# Patient Record
Sex: Male | Born: 1970 | Race: White | Hispanic: No | Smoking: Former smoker
Health system: Southern US, Community
[De-identification: ages and names within clinical notes are randomized; demographics above are authoritative.]

## PROBLEM LIST (undated history)

## (undated) DIAGNOSIS — E785 Hyperlipidemia, unspecified: Secondary | ICD-10-CM

## (undated) DIAGNOSIS — I2510 Atherosclerotic heart disease of native coronary artery without angina pectoris: Secondary | ICD-10-CM

## (undated) DIAGNOSIS — I10 Essential (primary) hypertension: Secondary | ICD-10-CM

## (undated) DIAGNOSIS — Z720 Tobacco use: Secondary | ICD-10-CM

## (undated) HISTORY — DX: Atherosclerotic heart disease of native coronary artery without angina pectoris: I25.10

## (undated) HISTORY — PX: KNEE ARTHROSCOPY: SHX127

## (undated) HISTORY — DX: Hyperlipidemia, unspecified: E78.5

## (undated) HISTORY — DX: Tobacco use: Z72.0

## (undated) HISTORY — PX: SHOULDER SURGERY: SHX246

---

## 2014-07-17 ENCOUNTER — Ambulatory Visit (HOSPITAL_COMMUNITY): Payer: Self-pay | Attending: Family Medicine

## 2014-07-17 ENCOUNTER — Emergency Department (INDEPENDENT_AMBULATORY_CARE_PROVIDER_SITE_OTHER): Payer: Self-pay

## 2014-07-17 ENCOUNTER — Emergency Department (INDEPENDENT_AMBULATORY_CARE_PROVIDER_SITE_OTHER)
Admission: EM | Admit: 2014-07-17 | Discharge: 2014-07-17 | Disposition: A | Discharge status: Home / Self Care | Payer: Self-pay | Source: Home / Self Care | Attending: Family Medicine | Admitting: Family Medicine

## 2014-07-17 ENCOUNTER — Encounter (HOSPITAL_COMMUNITY): Payer: Self-pay | Admitting: Emergency Medicine

## 2014-07-17 DIAGNOSIS — M7989 Other specified soft tissue disorders: Secondary | ICD-10-CM | POA: Insufficient documentation

## 2014-07-17 DIAGNOSIS — S93602A Unspecified sprain of left foot, initial encounter: Secondary | ICD-10-CM

## 2014-07-17 DIAGNOSIS — R52 Pain, unspecified: Secondary | ICD-10-CM

## 2014-07-17 DIAGNOSIS — M79672 Pain in left foot: Secondary | ICD-10-CM | POA: Insufficient documentation

## 2014-07-17 HISTORY — DX: Essential (primary) hypertension: I10

## 2014-07-17 MED ORDER — HYDROCODONE-ACETAMINOPHEN 5-325 MG PO TABS: 1.0000 | ORAL_TABLET | Freq: Four times a day (QID) | ORAL | Status: DC | PRN

## 2014-07-17 NOTE — ED Notes (Signed)
Reports stepping in a whole on Tuesday and rolling left foot.  Pt is c/o pain and swelling across the top of the left foot.  Bruising noted.    Pt has iced, soaked, and wrapped foot with no relief in pain.

## 2014-07-17 NOTE — Discharge Instructions (Signed)
Thank you for coming in today. If not better follow-up with [facility], or [facility].   Please call or see Ms Antionette CharMaggy Mena for assistance with your bill.  You may qualify for reduced or free services.  Her phone number is [phone]. Her email is [email or web address]  Foot Sprain The muscles and cord like structures which attach muscle to bone (tendons) that surround the feet are made up of units. A foot sprain can occur at the weakest spot in any of these units. This condition is most often caused by injury to or overuse of the foot, as from playing contact sports, or aggravating a previous injury, or from poor conditioning, or obesity. SYMPTOMS  Pain with movement of the foot.  Tenderness and swelling at the injury site.  Loss of strength is present in moderate or severe sprains. THE THREE GRADES OR SEVERITY OF FOOT SPRAIN ARE:  Mild (Grade I): Slightly pulled muscle without tearing of muscle or tendon fibers or loss of strength.  Moderate (Grade II): Tearing of fibers in a muscle, tendon, or at the attachment to bone, with small decrease in strength.  Severe (Grade III): Rupture of the muscle-tendon-bone attachment, with separation of fibers. Severe sprain requires surgical repair. Often repeating (chronic) sprains are caused by overuse. Sudden (acute) sprains are caused by direct injury or over-use. DIAGNOSIS  Diagnosis of this condition is usually by your own observation. If problems continue, a caregiver may be required for further evaluation and treatment. X-rays may be required to make sure there are not breaks in the bones (fractures) present. Continued problems may require physical therapy for treatment. PREVENTION  Use strength and conditioning exercises appropriate for your sport.  Warm up properly prior to working out.  Use athletic shoes that are made for the sport you are participating in.  Allow adequate time for  healing. Early return to activities makes repeat injury more likely, and can lead to an unstable arthritic foot that can result in prolonged disability. Mild sprains generally heal in 3 to 10 days, with moderate and severe sprains taking 2 to 10 weeks. Your caregiver can help you determine the proper time required for healing. HOME CARE INSTRUCTIONS   Apply ice to the injury for 15-20 minutes, 03-04 times per day. Put the ice in a plastic bag and place a towel between the bag of ice and your skin.  An elastic wrap (like an Ace bandage) may be used to keep swelling down.  Keep foot above the level of the heart, or at least raised on a footstool, when swelling and pain are present.  Try to avoid use other than gentle range of motion while the foot is painful. Do not resume use until instructed by your caregiver. Then begin use gradually, not increasing use to the point of pain. If pain does develop, decrease use and continue the above measures, gradually increasing activities that do not cause discomfort, until you gradually achieve normal use.  Use crutches if and as instructed, and for the length of time instructed.  Keep injured foot and ankle wrapped between treatments.  Massage foot and ankle for comfort and to keep swelling down. Massage from the toes up towards the knee.  Only take over-the-counter or prescription medicines for pain, discomfort, or fever as directed by your caregiver. SEEK IMMEDIATE MEDICAL CARE IF:   Your pain and swelling increase, or pain is not controlled with medications.  You have loss of feeling in your foot or your  foot turns cold or blue.  You develop new, unexplained symptoms, or an increase of the symptoms that brought you to your caregiver. MAKE SURE YOU:   Understand these instructions.  Will watch your condition.  Will get help right away if you are not doing well or get worse. Document Released: 11/19/2001 Document Revised: 08/22/2011 Document  Reviewed: 01/17/2008 [facility] Patient Information 2015 [place], [place]. This information is not intended to replace advice given to you by your health care provider. Make sure you discuss any questions you have with your health care provider.

## 2014-07-17 NOTE — ED Provider Notes (Signed)
Timothy Molson Coors BrewingHowle Jr. is a 44 y.o. male who presents to Urgent Care today for left foot injury. Patient stepped into a hole 2 days ago injuring his mid foot. He has pain and swelling at his left dorsal midfoot. He notes pain is worse with ambulation. No radiating pain weakness or numbness.   Past Medical History  Diagnosis Date  . Hypertension    Past Surgical History  Procedure Laterality Date  . Shoulder surgery     History  Substance Use Topics  . Smoking status: Current Every Day Smoker -- 0.50 packs/day    Types: Cigarettes  . Smokeless tobacco: Not on file  . Alcohol Use: No   ROS as above Medications: No current facility-administered medications for this encounter.   Current Outpatient Prescriptions  Medication Sig Dispense Refill  . HYDROcodone-acetaminophen (NORCO/VICODIN) 5-325 MG per tablet Take 1 tablet by mouth every 6 (six) hours as needed. 15 tablet 0   No Known Allergies   Exam:  BP 144/97 mmHg  Pulse 102  Temp(Src) 97.9 F (36.6 C) (Oral)  Resp 18  SpO2 95% Gen: Well NAD Left lower extremity: He is nontender normal motion Ankle nontender normal motion Foot swollen and tender overlying the dorsal midfoot especially of the Lisfranc area.  Pulses capillary refill and sensation are intact.   No results found for this or any previous visit (from the past 24 hour(s)). Dg Foot 2 Views Left  07/17/2014   CLINICAL DATA:  Left foot pain and swelling.  EXAM: LEFT FOOT - 2 views  COMPARISON:  None.  FINDINGS: There is no evidence of fracture or dislocation. Weight-bearing projections were obtained. Joint spaces are intact. Mild spurring of posterior calcaneus is noted. Soft tissues are unremarkable.  IMPRESSION: No acute abnormality seen in the left foot.   Electronically Signed   By: Timothy LiasJames  [name] M.D.   On: 07/17/2014 20:02   Dg Foot Complete Left  07/17/2014   CLINICAL DATA:  44 year old male with a history of foot injury. Injury occurred 2 days prior.  EXAM: LEFT FOOT -  COMPLETE 3+ VIEW  COMPARISON:  07/17/2014  FINDINGS: No evidence of acute bony abnormality. No significant soft tissue swelling. No radiopaque foreign body.  Enthesophyte formation at the insertion of the plantar fascia.  Mild degenerative changes of the interphalangeal joints.  IMPRESSION: No acute bony abnormality.  Signed,  Timothy NeuJaime S. Loreta AveWagner, DO  [facility]  [facility]   Electronically Signed   By: Timothy MorJaime  [name] D.O.   On: 07/17/2014 20:35    Assessment and Plan: 44 y.o. male with foot sprain. No radiographic evidence for Lisfranc injury. Treat with limited weightbearing with postop shoe. Naproxen and Norco for pain control. Follow up with sports medicine orthopedics as needed.  Discussed warning signs or symptoms. Please see discharge instructions. Patient expresses understanding.     Timothy BongEvan S Jonah Nestle, MD 07/17/14 2052

## 2015-11-16 ENCOUNTER — Ambulatory Visit: Payer: Self-pay | Admitting: Physical Therapy

## 2016-06-08 ENCOUNTER — Ambulatory Visit (INDEPENDENT_AMBULATORY_CARE_PROVIDER_SITE_OTHER): Payer: BLUE CROSS/BLUE SHIELD | Admitting: Physician Assistant

## 2016-06-08 ENCOUNTER — Ambulatory Visit (INDEPENDENT_AMBULATORY_CARE_PROVIDER_SITE_OTHER): Payer: BLUE CROSS/BLUE SHIELD

## 2016-06-08 VITALS — BP 142/86 | HR 120 | Temp 98.4°F | Resp 18 | Ht 70.0 in

## 2016-06-08 DIAGNOSIS — M109 Gout, unspecified: Secondary | ICD-10-CM | POA: Diagnosis not present

## 2016-06-08 DIAGNOSIS — M79672 Pain in left foot: Secondary | ICD-10-CM | POA: Diagnosis not present

## 2016-06-08 MED ORDER — INDOMETHACIN 50 MG PO CAPS: 50.0000 mg | ORAL_CAPSULE | Freq: Two times a day (BID) | ORAL | 0 refills | Status: AC

## 2016-06-08 NOTE — Progress Notes (Signed)
   Timothy Molson Coors BrewingHowle Jr.  MRN: [id number] DOB: 08/20/1970  PCP: No primary care provider on file.  Subjective:  Pt is a 45 year old male who presents to clinic for left foot pain x five days.  Pain started five days ago on the outside of his left foot. Hurts more with walking, however pain is still present when he is non-weightbearing. Last night he noticed pain at the knuckle of his left great toe. Pain was so bad it hurt when the bed sheets touched it. Feels hot to touch.  He reports an increase in the amount he has been walking recently at work. He has eaten a lot more meat products during the holiday.  History of gout  - several years ago left knee.   Review of Systems  Constitutional: Negative for chills, diaphoresis and fever.  Respiratory: Negative for cough, chest tightness, shortness of breath and wheezing.   Cardiovascular: Negative for chest pain and palpitations.  Gastrointestinal: Negative for diarrhea, nausea and vomiting.  Musculoskeletal: Positive for arthralgias and gait problem.  Skin: Positive for color change.  Neurological: Negative for dizziness, syncope, light-headedness and headaches.  Psychiatric/Behavioral: Negative for sleep disturbance. The patient is not nervous/anxious.     There are no active problems to display for this patient.   No current outpatient prescriptions on file prior to visit.   No current facility-administered medications on file prior to visit.     No Known Allergies   Objective:  BP (!) 142/86 (BP Location: Right Arm, Patient Position: Sitting, Cuff Size: Large)   Pulse (!) 120   Temp 98.4 F (36.9 C)   Resp 18   Ht 5\' 10"  (1.778 m)   SpO2 97%   Physical Exam  Constitutional: He is oriented to person, place, and time and well-developed, well-nourished, and in no distress. No distress.  Cardiovascular: Normal rate, regular rhythm and normal heart sounds.   Pulmonary/Chest: Effort normal. No respiratory distress.  Musculoskeletal:      Left foot: There is tenderness (MTP joint Great toe), bony tenderness (Proximal 5th metatarsal) and swelling. There is no crepitus and no deformity.  Neurological: He is alert and oriented to person, place, and time. GCS score is 15.  Skin: Skin is warm and dry.  Psychiatric: Mood, memory, affect and judgment normal.  Vitals reviewed.  Dg Foot Complete Left  Result Date: 06/08/2016 CLINICAL DATA:  Pain at the proximal fifth metatarsal. EXAM: LEFT FOOT - COMPLETE 3+ VIEW COMPARISON:  07/27/2014. FINDINGS: No acute bone or soft tissue abnormality is present. An accessory ossicle is noted lateral to the cuboid. The fifth metatarsal is intact. A small calcaneal spur is noted. IMPRESSION: 1. No acute abnormality or significant interval change. Electronically Signed   By: Marin Robertshristopher  Mattern M.D.   On: 06/08/2016 14:27   Assessment and Plan :  1. Left foot pain 2. Acute gout of left foot, unspecified cause - DG Foot Complete Left; Future - indomethacin (INDOCIN) 50 MG capsule; Take 1 capsule (50 mg total) by mouth 2 (two) times daily with a meal.  Dispense: 14 capsule; Refill: 0 - Advised patient about gout diet. RTC in 7-10 days if no improvement.    Marco CollieWhitney Anara Cowman, PA-C  [facility] 06/08/2016 2:04 PM

## 2016-06-08 NOTE — Patient Instructions (Addendum)
Please take your medication as directed. See below for gout information. Come back if you are not better after one week.   Gout Gout is painful swelling that can occur in some of your joints. Gout is a type of arthritis. This condition is caused by having too much uric acid in your body. Uric acid is a chemical that forms when your body breaks down substances called purines. Purines are important for building body proteins. When your body has too much uric acid, sharp crystals can form and build up inside your joints. This causes pain and swelling. Gout attacks can happen quickly and be very painful (acute gout). Over time, the attacks can affect more joints and become more frequent (chronic gout). Gout can also cause uric acid to build up under your skin and inside your kidneys. What are the causes? This condition is caused by too much uric acid in your blood. This can occur because:  Your kidneys do not remove enough uric acid from your blood. This is the most common cause.  Your body makes too much uric acid. This can occur with some cancers and cancer treatments. It can also occur if your body is breaking down too many red blood cells (hemolytic anemia).  You eat too many foods that are high in purines. These foods include organ meats and some seafood. Alcohol, especially beer, is also high in purines. A gout attack may be triggered by trauma or stress. What increases the risk? This condition is more likely to develop in people who:  Have a family history of gout.  Are male and middle-aged.  Are male and have gone through menopause.  Are obese.  Frequently drink alcohol, especially beer.  Are dehydrated.  Lose weight too quickly.  Have an organ transplant.  Have lead poisoning.  Take certain medicines, including aspirin, cyclosporine, diuretics, levodopa, and niacin.  Have kidney disease or psoriasis. What are the signs or symptoms? An attack of acute gout happens  quickly. It usually occurs in just one joint. The most common place is the big toe. Attacks often start at night. Other joints that may be affected include joints of the feet, ankle, knee, fingers, wrist, or elbow. Symptoms may include:  Severe pain.  Warmth.  Swelling.  Stiffness.  Tenderness. The affected joint may be very painful to touch.  Shiny, red, or purple skin.  Chills and fever. Chronic gout may cause symptoms more frequently. More joints may be involved. You may also have white or yellow lumps (tophi) on your hands or feet or in other areas near your joints. How is this diagnosed? This condition is diagnosed based on your symptoms, medical history, and physical exam. You may have tests, such as:  Blood tests to measure uric acid levels.  Removal of joint fluid with a needle (aspiration) to look for uric acid crystals.  X-rays to look for joint damage. How is this treated? Treatment for this condition has two phases: treating an acute attack and preventing future attacks. Acute gout treatment may include medicines to reduce pain and swelling, including:  NSAIDs.  Steroids. These are strong anti-inflammatory medicines that can be taken by mouth (orally) or injected into a joint.  Colchicine. This medicine relieves pain and swelling when it is taken soon after an attack. It can be given orally or through an IV tube. Preventive treatment may include:  Daily use of smaller doses of NSAIDs or colchicine.  Use of a medicine that reduces uric acid levels in  your blood.  Changes to your diet. You may need to see a specialist about healthy eating (dietitian). Follow these instructions at home: During a Gout Attack  If directed, apply ice to the affected area:  Put ice in a plastic bag.  Place a towel between your skin and the bag.  Leave the ice on for 20 minutes, 2-3 times a day.  Rest the joint as much as possible. If the affected joint is in your leg, you may be  given crutches to use.  Raise (elevate) the affected joint above the level of your heart as often as possible.  Drink enough fluids to keep your urine clear or pale yellow.  Take over-the-counter and prescription medicines only as told by your health care provider.  Do not drive or operate heavy machinery while taking prescription pain medicine.  Follow instructions from your health care provider about eating or drinking restrictions.  Return to your normal activities as told by your health care provider. Ask your health care provider what activities are safe for you. Avoiding Future Gout Attacks  Follow a low-purine diet as told by your dietitian or health care provider. Avoid foods and drinks that are high in purines, including liver, kidney, anchovies, asparagus, herring, mushrooms, mussels, and beer.  Limit alcohol intake to no more than 1 drink a day for nonpregnant women and 2 drinks a day for men. One drink equals 12 oz of beer, 5 oz of wine, or 1 oz of hard liquor.  Maintain a healthy weight or lose weight if you are overweight. If you want to lose weight, talk with your health care provider. It is important that you do not lose weight too quickly.  Start or maintain an exercise program as told by your health care provider.  Drink enough fluids to keep your urine clear or pale yellow.  Take over-the-counter and prescription medicines only as told by your health care provider.  Keep all follow-up visits as told by your health care provider. This is important. Contact a health care provider if:  You have another gout attack.  You continue to have symptoms of a gout attack after10 days of treatment.  You have side effects from your medicines.  You have chills or a fever.  You have burning pain when you urinate.  You have pain in your lower back or belly. Get help right away if:  You have severe or uncontrolled pain.  You cannot urinate. This information is not  intended to replace advice given to you by your health care provider. Make sure you discuss any questions you have with your health care provider. Document Released: 05/27/2000 Document Revised: 11/05/2015 Document Reviewed: 03/12/2015 Elsevier Interactive Patient Education  2017 [employer]    IF you received an x-ray today, you will receive an invoice from [facility]. Please contact [facility] at [phone] with questions or concerns regarding your invoice.   IF you received labwork today, you will receive an invoice from [facility]. Please contact [facility] at [phone] with questions or concerns regarding your invoice.   Our billing staff will not be able to assist you with questions regarding bills from these companies.  You will be contacted with the lab results as soon as they are available. The fastest way to get your results is to activate your My Chart account. Instructions are located on the last page of this paperwork. If you have not heard from us regarding the results in 2 weeks, please contact this office.

## 2016-12-05 DIAGNOSIS — J029 Acute pharyngitis, unspecified: Secondary | ICD-10-CM | POA: Diagnosis not present

## 2017-07-11 DIAGNOSIS — M109 Gout, unspecified: Secondary | ICD-10-CM | POA: Diagnosis not present

## 2017-07-11 DIAGNOSIS — M79672 Pain in left foot: Secondary | ICD-10-CM | POA: Diagnosis not present

## 2017-07-11 DIAGNOSIS — J019 Acute sinusitis, unspecified: Secondary | ICD-10-CM | POA: Diagnosis not present

## 2017-09-07 DIAGNOSIS — J209 Acute bronchitis, unspecified: Secondary | ICD-10-CM | POA: Diagnosis not present

## 2018-03-24 ENCOUNTER — Emergency Department (HOSPITAL_COMMUNITY)
Admission: EM | Admit: 2018-03-24 | Discharge: 2018-03-24 | Disposition: A | Discharge status: Home / Self Care | Payer: Worker's Compensation | Attending: Emergency Medicine | Admitting: Emergency Medicine

## 2018-03-24 ENCOUNTER — Other Ambulatory Visit: Payer: Self-pay

## 2018-03-24 ENCOUNTER — Encounter (HOSPITAL_COMMUNITY): Payer: Self-pay | Admitting: Emergency Medicine

## 2018-03-24 DIAGNOSIS — Y929 Unspecified place or not applicable: Secondary | ICD-10-CM | POA: Insufficient documentation

## 2018-03-24 DIAGNOSIS — X500XXA Overexertion from strenuous movement or load, initial encounter: Secondary | ICD-10-CM | POA: Diagnosis not present

## 2018-03-24 DIAGNOSIS — I10 Essential (primary) hypertension: Secondary | ICD-10-CM | POA: Diagnosis not present

## 2018-03-24 DIAGNOSIS — Y999 Unspecified external cause status: Secondary | ICD-10-CM | POA: Diagnosis not present

## 2018-03-24 DIAGNOSIS — S39012A Strain of muscle, fascia and tendon of lower back, initial encounter: Secondary | ICD-10-CM | POA: Diagnosis not present

## 2018-03-24 DIAGNOSIS — F17210 Nicotine dependence, cigarettes, uncomplicated: Secondary | ICD-10-CM | POA: Insufficient documentation

## 2018-03-24 DIAGNOSIS — S3992XA Unspecified injury of lower back, initial encounter: Secondary | ICD-10-CM | POA: Diagnosis present

## 2018-03-24 DIAGNOSIS — Y939 Activity, unspecified: Secondary | ICD-10-CM | POA: Insufficient documentation

## 2018-03-24 MED ORDER — HYDROCODONE-ACETAMINOPHEN 5-325 MG PO TABS: 2.0000 | ORAL_TABLET | ORAL | 0 refills | Status: DC | PRN

## 2018-03-24 MED ORDER — KETOROLAC TROMETHAMINE 60 MG/2ML IM SOLN
60.0000 mg | Freq: Once | INTRAMUSCULAR | Status: AC
  Administered 2018-03-24: 60 mg via INTRAMUSCULAR
  Filled 2018-03-24: qty 2

## 2018-03-24 MED ORDER — METHOCARBAMOL 500 MG PO TABS: 500.0000 mg | ORAL_TABLET | Freq: Two times a day (BID) | ORAL | 0 refills | Status: DC

## 2018-03-24 MED ORDER — METHYLPREDNISOLONE SODIUM SUCC 125 MG IJ SOLR
125.0000 mg | Freq: Once | INTRAMUSCULAR | Status: AC
  Administered 2018-03-24: 125 mg via INTRAMUSCULAR
  Filled 2018-03-24: qty 2

## 2018-03-24 MED ORDER — PREDNISONE 10 MG PO TABS: ORAL_TABLET | ORAL | 0 refills | Status: DC

## 2018-03-24 NOTE — ED Provider Notes (Signed)
[facility] EMERGENCY DEPARTMENT Provider Note   CSN: [id number] Arrival date & time: 03/24/18  1324     History   Chief Complaint Chief Complaint  Patient presents with  . Back Pain    HPI Timothy [name]. is a 47 y.o. male.  The history is provided by the patient. No language interpreter was used.  Back Pain   This is a new problem. The problem occurs constantly. The problem has been gradually worsening. The pain is present in the lumbar spine. The quality of the pain is described as aching. The pain radiates to the left thigh. The pain is moderate. The symptoms are aggravated by bending and twisting. Pertinent negatives include no chest pain and no abdominal pain.   Pt reports he twisted his back while at work.  Pt complains of pain in his low back that shoots into his leg.  No loss of bowel or bladder control no weakness Past Medical History:  Diagnosis Date  . Hypertension     There are no active problems to display for this patient.   Past Surgical History:  Procedure Laterality Date  . KNEE ARTHROSCOPY    . SHOULDER SURGERY          Home Medications    Prior to Admission medications   Medication Sig Start Date End Date Taking? Authorizing Provider  HYDROcodone-acetaminophen (NORCO/VICODIN) 5-325 MG tablet Take 2 tablets by mouth every 4 (four) hours as needed. 03/24/18   Elson Areas, PA-C  methocarbamol (ROBAXIN) 500 MG tablet Take 1 tablet (500 mg total) by mouth 2 (two) times daily. 03/24/18   Elson Areas, PA-C  predniSONE (DELTASONE) 10 MG tablet 5,4,3,2,1 taper 03/24/18   Elson Areas, PA-C    Family History History reviewed. No pertinent family history.  Social History Social History   Tobacco Use  . Smoking status: Current Every Day Smoker    Packs/day: 1.00    Types: Cigarettes  . Smokeless tobacco: Current User    Types: Snuff  Substance Use Topics  . Alcohol use: No  . Drug use: No     Allergies   Morphine and  related   Review of Systems Review of Systems  Cardiovascular: Negative for chest pain.  Gastrointestinal: Negative for abdominal pain.  Musculoskeletal: Positive for back pain.  All other systems reviewed and are negative.    Physical Exam Updated Vital Signs BP (!) 138/101 (BP Location: Right Arm)   Pulse 100   Temp 98.2 F (36.8 C) (Oral)   Resp 18   Ht 5\' 10"  (1.778 m)   Wt 117.9 kg   SpO2 96%   BMI 37.31 kg/m   Physical Exam  Constitutional: He appears well-developed and well-nourished.  HENT:  Head: Normocephalic and atraumatic.  Eyes: Conjunctivae are normal.  Neck: Neck supple.  Cardiovascular: Normal rate and regular rhythm.  No murmur heard. Pulmonary/Chest: Effort normal and breath sounds normal. No respiratory distress.  Abdominal: Soft. There is no tenderness.  Musculoskeletal: He exhibits no edema.  Tender lower lumbar,  Pain with movement,  nv and ns intact   Neurological: He is alert.  Skin: Skin is warm and dry.  Psychiatric: He has a normal mood and affect.  Nursing note and vitals reviewed.    ED Treatments / Results  Labs (all labs ordered are listed, but only abnormal results are displayed) Labs Reviewed - No data to display  EKG None  Radiology No results found.  Procedures Procedures (including critical care  time)  Medications Ordered in ED Medications  methylPREDNISolone sodium succinate (SOLU-MEDROL) 125 mg/2 mL injection 125 mg (125 mg Intramuscular Given 03/24/18 0935)  ketorolac (TORADOL) injection 60 mg (60 mg Intramuscular Given 03/24/18 0935)     Initial Impression / Assessment and Plan / ED Course  I have reviewed the triage vital signs and the nursing notes.  Pertinent labs & imaging results that were available during my care of the patient were reviewed by me and considered in my medical decision making (see chart for details).     MDM  Pt given injection of solumedrol and torodol.   Pt given rx for prednisone,  hydrocodone and robaxin   Final Clinical Impressions(s) / ED Diagnoses   Final diagnoses:  Strain of lumbar region, initial encounter    ED Discharge Orders         Ordered    predniSONE (DELTASONE) 10 MG tablet     03/24/18 1043    HYDROcodone-acetaminophen (NORCO/VICODIN) 5-325 MG tablet  Every 4 hours PRN     03/24/18 1043    methocarbamol (ROBAXIN) 500 MG tablet  2 times daily     03/24/18 1043        An After Visit Summary was printed and given to the patient.    Elson Areas, PA-C 03/24/18 1224    Donnetta Hutching, MD 03/24/18 [phone]

## 2018-03-24 NOTE — ED Triage Notes (Signed)
Lifting with twisting motion while at work. Pt states felt pop and now has numbness in left leg.

## 2018-03-27 DIAGNOSIS — M109 Gout, unspecified: Secondary | ICD-10-CM | POA: Diagnosis not present

## 2018-03-27 DIAGNOSIS — R7309 Other abnormal glucose: Secondary | ICD-10-CM | POA: Diagnosis not present

## 2018-04-04 DIAGNOSIS — R7309 Other abnormal glucose: Secondary | ICD-10-CM | POA: Diagnosis not present

## 2018-04-04 DIAGNOSIS — Z13220 Encounter for screening for lipoid disorders: Secondary | ICD-10-CM | POA: Diagnosis not present

## 2018-04-04 DIAGNOSIS — Z0001 Encounter for general adult medical examination with abnormal findings: Secondary | ICD-10-CM | POA: Diagnosis not present

## 2018-04-04 DIAGNOSIS — M109 Gout, unspecified: Secondary | ICD-10-CM | POA: Diagnosis not present

## 2018-04-04 DIAGNOSIS — Z125 Encounter for screening for malignant neoplasm of prostate: Secondary | ICD-10-CM | POA: Diagnosis not present

## 2018-04-24 DIAGNOSIS — R7309 Other abnormal glucose: Secondary | ICD-10-CM | POA: Diagnosis not present

## 2018-04-24 DIAGNOSIS — M109 Gout, unspecified: Secondary | ICD-10-CM | POA: Diagnosis not present

## 2018-04-24 DIAGNOSIS — E7800 Pure hypercholesterolemia, unspecified: Secondary | ICD-10-CM | POA: Diagnosis not present

## 2018-05-31 DIAGNOSIS — M79671 Pain in right foot: Secondary | ICD-10-CM | POA: Diagnosis not present

## 2018-05-31 DIAGNOSIS — M7731 Calcaneal spur, right foot: Secondary | ICD-10-CM | POA: Diagnosis not present

## 2018-06-22 DIAGNOSIS — M7731 Calcaneal spur, right foot: Secondary | ICD-10-CM | POA: Diagnosis not present

## 2018-06-22 DIAGNOSIS — M109 Gout, unspecified: Secondary | ICD-10-CM | POA: Diagnosis not present

## 2018-06-22 DIAGNOSIS — M79671 Pain in right foot: Secondary | ICD-10-CM | POA: Diagnosis not present

## 2018-06-28 ENCOUNTER — Other Ambulatory Visit: Payer: Self-pay | Admitting: Podiatry

## 2018-06-28 ENCOUNTER — Ambulatory Visit (INDEPENDENT_AMBULATORY_CARE_PROVIDER_SITE_OTHER): Payer: BLUE CROSS/BLUE SHIELD

## 2018-06-28 ENCOUNTER — Ambulatory Visit (INDEPENDENT_AMBULATORY_CARE_PROVIDER_SITE_OTHER): Payer: BLUE CROSS/BLUE SHIELD | Admitting: Podiatry

## 2018-06-28 ENCOUNTER — Encounter: Payer: Self-pay | Admitting: Podiatry

## 2018-06-28 VITALS — BP 163/97 | HR 100 | Resp 16

## 2018-06-28 DIAGNOSIS — M109 Gout, unspecified: Secondary | ICD-10-CM | POA: Diagnosis not present

## 2018-06-28 DIAGNOSIS — M779 Enthesopathy, unspecified: Secondary | ICD-10-CM

## 2018-06-28 DIAGNOSIS — M79671 Pain in right foot: Secondary | ICD-10-CM

## 2018-06-28 DIAGNOSIS — E7800 Pure hypercholesterolemia, unspecified: Secondary | ICD-10-CM | POA: Diagnosis not present

## 2018-06-28 DIAGNOSIS — E039 Hypothyroidism, unspecified: Secondary | ICD-10-CM | POA: Diagnosis not present

## 2018-06-28 DIAGNOSIS — M7661 Achilles tendinitis, right leg: Secondary | ICD-10-CM

## 2018-06-28 DIAGNOSIS — E119 Type 2 diabetes mellitus without complications: Secondary | ICD-10-CM | POA: Diagnosis not present

## 2018-06-28 DIAGNOSIS — I10 Essential (primary) hypertension: Secondary | ICD-10-CM | POA: Diagnosis not present

## 2018-06-28 MED ORDER — DICLOFENAC SODIUM 75 MG PO TBEC: 75.0000 mg | DELAYED_RELEASE_TABLET | Freq: Two times a day (BID) | ORAL | 2 refills | Status: DC

## 2018-06-28 MED ORDER — TRIAMCINOLONE ACETONIDE 10 MG/ML IJ SUSP
10.0000 mg | Freq: Once | INTRAMUSCULAR | Status: AC
  Administered 2018-06-28: 10 mg

## 2018-06-28 NOTE — Progress Notes (Signed)
Subjective:   Patient ID: Timothy Lenz., male   DOB: 48 y.o.   MRN: [id number]   HPI Patient presents stating is a lot of pain in the back of his right heel and it started to hurt after he hurt his back in October.  States he was walking differently also has had numerous gout attacks and it is that sound like he is ever had his uric acid level checked and he takes medicine for acute attack but does not take medicine for any form of chronic gout even though he has had numerous attacks in his big toe joints.  Patient does smoke a pack per day and does not currently have activity like he would like   Review of Systems  All other systems reviewed and are negative.       Objective:  Physical Exam Vitals signs and nursing note reviewed.  Constitutional:      Appearance: He is well-developed.  Pulmonary:     Effort: Pulmonary effort is normal.  Musculoskeletal: Normal range of motion.  Skin:    General: Skin is warm.  Neurological:     Mental Status: He is alert.     Neurovascular status intact muscle strength is adequate range of motion within normal limits with patient noted to have exquisite discomfort in the boot posterior heel region medial side with a central lateral band not currently involved.  He has mild equinus and he has good muscle strength of the Achilles and it is very painful with press with mild swelling noted.  Patient has mild swelling around the big toe joint bilateral states he is had chronic gout attacks     Assessment:  Inflammatory Achilles tendon at the insertion right medial side with history of gout     Plan:  H&P x-ray reviewed and I discussed careful injection explaining the risk of injection to patient.  Patient wants injection and at this time understands risk and understands chances for tear of the Achilles tendon.  I did a sterile prep of the medial side carefully injected on the medial side 3 mg Dexasone Kenalog 5 mg Xylocaine and applied air fracture  walker to completely immobilize and allow the area to rest.  Gave instructions on reduced activity I am also sending for blood work to understand his uric acid with possibility for allopurinol or other treatment for chronic gout symptomatology  X-ray indicates small posterior spur and plantar spur and I did not see significant ostial lysis around the big toe joint right

## 2018-06-28 NOTE — Patient Instructions (Addendum)
Achilles Tendinitis  with Rehab Achilles tendinitis is a disorder of the Achilles tendon. The Achilles tendon connects the large calf muscles (Gastrocnemius and Soleus) to the heel bone (calcaneus). This tendon is sometimes called the heel cord. It is important for pushing-off and standing on your toes and is important for walking, running, or jumping. Tendinitis is often caused by overuse and repetitive microtrauma. SYMPTOMS  Pain, tenderness, swelling, warmth, and redness may occur over the Achilles tendon even at rest.  Pain with pushing off, or flexing or extending the ankle.  Pain that is worsened after or during activity. CAUSES   Overuse sometimes seen with rapid increase in exercise programs or in sports requiring running and jumping.  Poor physical conditioning (strength and flexibility or endurance).  Running sports, especially training running down hills.  Inadequate warm-up before practice or play or failure to stretch before participation.  Injury to the tendon. PREVENTION   Warm up and stretch before practice or competition.  Allow time for adequate rest and recovery between practices and competition.  Keep up conditioning.  Keep up ankle and leg flexibility.  Improve or keep muscle strength and endurance.  Improve cardiovascular fitness.  Use proper technique.  Use proper equipment (shoes, skates).  To help prevent recurrence, taping, protective strapping, or an adhesive bandage may be recommended for several weeks after healing is complete. PROGNOSIS   Recovery may take weeks to several months to heal.  Longer recovery is expected if symptoms have been prolonged.  Recovery is usually quicker if the inflammation is due to a direct blow as compared with overuse or sudden strain. RELATED COMPLICATIONS   Healing time will be prolonged if the condition is not correctly treated. The injury must be given plenty of time to heal.  Symptoms can reoccur if  activity is resumed too soon.  Untreated, tendinitis may increase the risk of tendon rupture requiring additional time for recovery and possibly surgery. TREATMENT   The first treatment consists of rest anti-inflammatory medication, and ice to relieve the pain.  Stretching and strengthening exercises after resolution of pain will likely help reduce the risk of recurrence. Referral to a physical therapist or athletic trainer for further evaluation and treatment may be helpful.  A walking boot or cast may be recommended to rest the Achilles tendon. This can help break the cycle of inflammation and microtrauma.  Arch supports (orthotics) may be prescribed or recommended by your caregiver as an adjunct to therapy and rest.  Surgery to remove the inflamed tendon lining or degenerated tendon tissue is rarely necessary and has shown less than predictable results. MEDICATION   Nonsteroidal anti-inflammatory medications, such as aspirin and ibuprofen, may be used for pain and inflammation relief. Do not take within 7 days before surgery. Take these as directed by your caregiver. Contact your caregiver immediately if any bleeding, stomach upset, or signs of allergic reaction occur. Other minor pain relievers, such as acetaminophen, may also be used.  Pain relievers may be prescribed as necessary by your caregiver. Do not take prescription pain medication for longer than 4 to 7 days. Use only as directed and only as much as you need.  Cortisone injections are rarely indicated. Cortisone injections may weaken tendons and predispose to rupture. It is better to give the condition more time to heal than to use them. HEAT AND COLD  Cold is used to relieve pain and reduce inflammation for acute and chronic Achilles tendinitis. Cold should be applied for 10 to 15 minutes   every 2 to 3 hours for inflammation and pain and immediately after any activity that aggravates your symptoms. Use ice packs or an ice  massage.  Heat may be used before performing stretching and strengthening activities prescribed by your caregiver. Use a heat pack or a warm soak. SEEK MEDICAL CARE IF:  Symptoms get worse or do not improve in 2 weeks despite treatment.  New, unexplained symptoms develop. Drugs used in treatment may produce side effects.  EXERCISES:  RANGE OF MOTION (ROM) AND STRETCHING EXERCISES - Achilles Tendinitis  These exercises may help you when beginning to rehabilitate your injury. Your symptoms may resolve with or without further involvement from your physician, physical therapist or athletic trainer. While completing these exercises, remember:   Restoring tissue flexibility helps normal motion to return to the joints. This allows healthier, less painful movement and activity.  An effective stretch should be held for at least 30 seconds.  A stretch should never be painful. You should only feel a gentle lengthening or release in the stretched tissue.  STRETCH  Gastroc, Standing   Place hands on wall.  Extend right / left leg, keeping the front knee somewhat bent.  Slightly point your toes inward on your back foot.  Keeping your right / left heel on the floor and your knee straight, shift your weight toward the wall, not allowing your back to arch.  You should feel a gentle stretch in the right / left calf. Hold this position for 10 seconds. Repeat 3 times. Complete this stretch 2 times per day.  STRETCH  Soleus, Standing   Place hands on wall.  Extend right / left leg, keeping the other knee somewhat bent.  Slightly point your toes inward on your back foot.  Keep your right / left heel on the floor, bend your back knee, and slightly shift your weight over the back leg so that you feel a gentle stretch deep in your back calf.  Hold this position for 10 seconds. Repeat 3 times. Complete this stretch 2 times per day.  STRETCH  Gastrocsoleus, Standing  Note: This exercise can place  a lot of stress on your foot and ankle. Please complete this exercise only if specifically instructed by your caregiver.   Place the ball of your right / left foot on a step, keeping your other foot firmly on the same step.  Hold on to the wall or a rail for balance.  Slowly lift your other foot, allowing your body weight to press your heel down over the edge of the step.  You should feel a stretch in your right / left calf.  Hold this position for 10 seconds.  Repeat this exercise with a slight bend in your knee. Repeat 3 times. Complete this stretch 2 times per day.   STRENGTHENING EXERCISES - Achilles Tendinitis These exercises may help you when beginning to rehabilitate your injury. They may resolve your symptoms with or without further involvement from your physician, physical therapist or athletic trainer. While completing these exercises, remember:   Muscles can gain both the endurance and the strength needed for everyday activities through controlled exercises.  Complete these exercises as instructed by your physician, physical therapist or athletic trainer. Progress the resistance and repetitions only as guided.  You may experience muscle soreness or fatigue, but the pain or discomfort you are trying to eliminate should never worsen during these exercises. If this pain does worsen, stop and make certain you are following the directions exactly. If   the pain is still present after adjustments, discontinue the exercise until you can discuss the trouble with your clinician.  STRENGTH - Plantar-flexors   Sit with your right / left leg extended. Holding onto both ends of a rubber exercise band/tubing, loop it around the ball of your foot. Keep a slight tension in the band.  Slowly push your toes away from you, pointing them downward.  Hold this position for 10 seconds. Return slowly, controlling the tension in the band/tubing. Repeat 3 times. Complete this exercise 2 times per day.     STRENGTH - Plantar-flexors   Stand with your feet shoulder width apart. Steady yourself with a wall or table using as little support as needed.  Keeping your weight evenly spread over the width of your feet, rise up on your toes.*  Hold this position for 10 seconds. Repeat 3 times. Complete this exercise 2 times per day.  *If this is too easy, shift your weight toward your right / left leg until you feel challenged. Ultimately, you may be asked to do this exercise with your right / left foot only.  STRENGTH  Plantar-flexors, Eccentric  Note: This exercise can place a lot of stress on your foot and ankle. Please complete this exercise only if specifically instructed by your caregiver.   Place the balls of your feet on a step. With your hands, use only enough support from a wall or rail to keep your balance.  Keep your knees straight and rise up on your toes.  Slowly shift your weight entirely to your right / left toes and pick up your opposite foot. Gently and with controlled movement, lower your weight through your right / left foot so that your heel drops below the level of the step. You will feel a slight stretch in the back of your calf at the end position.  Use the healthy leg to help rise up onto the balls of both feet, then lower weight only on the right / left leg again. Build up to 15 repetitions. Then progress to 3 consecutive sets of 15 repetitions.*  After completing the above exercise, complete the same exercise with a slight knee bend (about 30 degrees). Again, build up to 15 repetitions. Then progress to 3 consecutive sets of 15 repetitions.* Perform this exercise 2 times per day.  *When you easily complete 3 sets of 15, your physician, physical therapist or athletic trainer may advise you to add resistance by wearing a backpack filled with additional weight.  STRENGTH - Plantar Flexors, Seated   Sit on a chair that allows your feet to rest flat on the ground. If  necessary, sit at the edge of the chair.  Keeping your toes firmly on the ground, lift your right / left heel as far as you can without increasing any discomfort in your ankle. Repeat 3 times. Complete this exercise 2 times a day.  Gout  Gout is painful swelling of your joints. Gout is a type of arthritis. It is caused by having too much uric acid in your body. Uric acid is a chemical that is made when your body breaks down substances called purines. If your body has too much uric acid, sharp crystals can form and build up in your joints. This causes pain and swelling. Gout attacks can happen quickly and be very painful (acute gout). Over time, the attacks can affect more joints and happen more often (chronic gout). What are the causes?  Too much uric acid in your  blood. This can happen because: ? Your kidneys do not remove enough uric acid from your blood. ? Your body makes too much uric acid. ? You eat too many foods that are high in purines. These foods include organ meats, some seafood, and beer.  Trauma or stress. What increases the risk?  Having a family history of gout.  Being male and middle-aged.  Being male and having gone through menopause.  Being very overweight (obese).  Drinking alcohol, especially beer.  Not having enough water in the body (being dehydrated).  Losing weight too quickly.  Having an organ transplant.  Having lead poisoning.  Taking certain medicines.  Having kidney disease.  Having a skin condition called psoriasis. What are the signs or symptoms? An attack of acute gout usually happens in just one joint. The most common place is the big toe. Attacks often start at night. Other joints that may be affected include joints of the feet, ankle, knee, fingers, wrist, or elbow. Symptoms of an attack may include:  Very bad pain.  Warmth.  Swelling.  Stiffness.  Shiny, red, or purple skin.  Tenderness. The affected joint may be very  painful to touch.  Chills and fever. Chronic gout may cause symptoms more often. More joints may be involved. You may also have white or yellow lumps (tophi) on your hands or feet or in other areas near your joints. How is this treated?  Treatment for this condition has two phases: treating an acute attack and preventing future attacks.  Acute gout treatment may include: ? NSAIDs. ? Steroids. These are taken by mouth or injected into a joint. ? Colchicine. This medicine relieves pain and swelling. It can be given by mouth or through an IV tube.  Preventive treatment may include: ? Taking small doses of NSAIDs or colchicine daily. ? Using a medicine that reduces uric acid levels in your blood. ? Making changes to your diet. You may need to see a food expert (dietitian) about what to eat and drink to prevent gout. Follow these instructions at home: During a gout attack   If told, put ice on the painful area: ? Put ice in a plastic bag. ? Place a towel between your skin and the bag. ? Leave the ice on for 20 minutes, 2-3 times a day.  Raise (elevate) the painful joint above the level of your heart as often as you can.  Rest the joint as much as possible. If the joint is in your leg, you may be given crutches.  Follow instructions from your doctor about what you cannot eat or drink. Avoiding future gout attacks  Eat a low-purine diet. Avoid foods and drinks such as: ? Liver. ? Kidney. ? Anchovies. ? Asparagus. ? Herring. ? Mushrooms. ? Mussels. ? Beer.  Stay at a healthy weight. If you want to lose weight, talk with your doctor. Do not lose weight too fast.  Start or continue an exercise plan as told by your doctor. Eating and drinking  Drink enough fluids to keep your pee (urine) pale yellow.  If you drink alcohol: ? Limit how much you use to:  0-1 drink a day for women.  0-2 drinks a day for men. ? Be aware of how much alcohol is in your drink. In the [place] one  drink equals one 12 oz bottle of beer (355 mL), one 5 oz glass of wine (148 mL), or one 1 oz glass of hard liquor (44 mL). General instructions  Take  over-the-counter and prescription medicines only as told by your doctor.  Do not drive or use heavy machinery while taking prescription pain medicine.  Return to your normal activities as told by your doctor. Ask your doctor what activities are safe for you.  Keep all follow-up visits as told by your doctor. This is important. Contact a doctor if:  You have another gout attack.  You still have symptoms of a gout attack after 10 days of treatment.  You have problems (side effects) because of your medicines.  You have chills or a fever.  You have burning pain when you pee (urinate).  You have pain in your lower back or belly. Get help right away if:  You have very bad pain.  Your pain cannot be controlled.  You cannot pee. Summary  Gout is painful swelling of the joints.  The most common site of pain is the big toe, but it can affect other joints.  Medicines and avoiding some foods can help to prevent and treat gout attacks. This information is not intended to replace advice given to you by your health care provider. Make sure you discuss any questions you have with your health care provider. Document Released: 03/08/2008 Document Revised: 12/20/2017 Document Reviewed: 12/20/2017 Elsevier Interactive Patient Education  2019 [employer].

## 2018-06-28 NOTE — Progress Notes (Signed)
   Subjective:    Patient ID: Timothy Lenz., male    DOB: [birth year], 48 y.o.   MRN: [id number]  HPI    Review of Systems  All other systems reviewed and are negative.      Objective:   Physical Exam        Assessment & Plan:

## 2018-06-29 LAB — ANA, IFA COMPREHENSIVE PANEL
Anti Nuclear Antibody(ANA): NEGATIVE
ENA SM Ab Ser-aCnc: <1 AI
SM/RNP: <1 AI
SSA (Ro) (ENA) Antibody, IgG: <1 AI
SSB (La) (ENA) Antibody, IgG: <1 AI
Scleroderma (Scl-70) (ENA) Antibody, IgG: <1 AI
ds DNA Ab: <1 IU/mL

## 2018-06-29 LAB — C-REACTIVE PROTEIN: CRP: 9.4 mg/L — ABNORMAL HIGH (ref <8.0)

## 2018-06-29 LAB — SEDIMENTATION RATE: Sed Rate: 6 mm/h (ref 0–15)

## 2018-06-29 LAB — URIC ACID: Uric Acid, Serum: 6.8 mg/dL (ref 4.0–8.0)

## 2018-06-29 LAB — RHEUMATOID FACTOR: Rhuematoid fact SerPl-aCnc: <14 IU/mL (ref <14)

## 2018-07-06 DIAGNOSIS — Z0000 Encounter for general adult medical examination without abnormal findings: Secondary | ICD-10-CM | POA: Diagnosis not present

## 2018-07-06 DIAGNOSIS — M109 Gout, unspecified: Secondary | ICD-10-CM | POA: Diagnosis not present

## 2018-07-06 DIAGNOSIS — E119 Type 2 diabetes mellitus without complications: Secondary | ICD-10-CM | POA: Diagnosis not present

## 2018-07-06 DIAGNOSIS — E7800 Pure hypercholesterolemia, unspecified: Secondary | ICD-10-CM | POA: Diagnosis not present

## 2018-07-26 ENCOUNTER — Encounter: Payer: Self-pay | Admitting: Podiatry

## 2018-07-26 ENCOUNTER — Ambulatory Visit: Payer: BLUE CROSS/BLUE SHIELD | Admitting: Podiatry

## 2018-07-26 DIAGNOSIS — M7661 Achilles tendinitis, right leg: Secondary | ICD-10-CM

## 2018-07-26 NOTE — Progress Notes (Signed)
Subjective:   Patient ID: Timothy Lenz., male   DOB: 48 y.o.   MRN: [id number]   HPI Patient states she is doing much better with significant reduction of his discomfort and admits he only wore his boot for several days   ROS      Objective:  Physical Exam  Neurovascular status intact with patient found to have discomfort in the posterior aspect of the right heel that has improved quite a bit with mild discomfort noted upon palpation     Assessment:  Significant improvement Achilles tendinitis right     Plan:  H&P condition reviewed and recommended continued stretching exercises appropriate shoe gear usage with lifted heel as best as possible and boot as needed and reappoint for [place] to recheck as needed

## 2018-10-30 DIAGNOSIS — E7800 Pure hypercholesterolemia, unspecified: Secondary | ICD-10-CM | POA: Diagnosis not present

## 2018-10-30 DIAGNOSIS — R7309 Other abnormal glucose: Secondary | ICD-10-CM | POA: Diagnosis not present

## 2018-10-30 DIAGNOSIS — M7732 Calcaneal spur, left foot: Secondary | ICD-10-CM | POA: Diagnosis not present

## 2018-10-30 DIAGNOSIS — E119 Type 2 diabetes mellitus without complications: Secondary | ICD-10-CM | POA: Diagnosis not present

## 2018-10-30 DIAGNOSIS — M109 Gout, unspecified: Secondary | ICD-10-CM | POA: Diagnosis not present

## 2018-10-30 DIAGNOSIS — M79672 Pain in left foot: Secondary | ICD-10-CM | POA: Diagnosis not present

## 2018-11-06 DIAGNOSIS — M7732 Calcaneal spur, left foot: Secondary | ICD-10-CM | POA: Diagnosis not present

## 2018-11-06 DIAGNOSIS — E119 Type 2 diabetes mellitus without complications: Secondary | ICD-10-CM | POA: Diagnosis not present

## 2018-11-06 DIAGNOSIS — M109 Gout, unspecified: Secondary | ICD-10-CM | POA: Diagnosis not present

## 2018-11-06 DIAGNOSIS — E7800 Pure hypercholesterolemia, unspecified: Secondary | ICD-10-CM | POA: Diagnosis not present

## 2019-01-14 DIAGNOSIS — J329 Chronic sinusitis, unspecified: Secondary | ICD-10-CM | POA: Diagnosis not present

## 2019-01-14 DIAGNOSIS — Z7189 Other specified counseling: Secondary | ICD-10-CM | POA: Diagnosis not present

## 2019-05-21 ENCOUNTER — Emergency Department (HOSPITAL_COMMUNITY): Payer: BC Managed Care – PPO

## 2019-05-21 ENCOUNTER — Other Ambulatory Visit: Payer: Self-pay

## 2019-05-21 ENCOUNTER — Inpatient Hospital Stay (HOSPITAL_COMMUNITY)
Admission: EM | Disposition: A | Discharge status: Home / Self Care | Payer: Self-pay | Source: Home / Self Care | Attending: Interventional Cardiology

## 2019-05-21 ENCOUNTER — Encounter (HOSPITAL_COMMUNITY): Payer: Self-pay | Admitting: Emergency Medicine

## 2019-05-21 ENCOUNTER — Inpatient Hospital Stay (HOSPITAL_COMMUNITY)
Admission: EM | Admit: 2019-05-21 | Discharge: 2019-05-23 | DRG: 247 | Disposition: A | Discharge status: Home / Self Care | Payer: BC Managed Care – PPO | Attending: Interventional Cardiology | Admitting: Interventional Cardiology

## 2019-05-21 DIAGNOSIS — F17290 Nicotine dependence, other tobacco product, uncomplicated: Secondary | ICD-10-CM | POA: Diagnosis not present

## 2019-05-21 DIAGNOSIS — Z791 Long term (current) use of non-steroidal anti-inflammatories (NSAID): Secondary | ICD-10-CM | POA: Diagnosis not present

## 2019-05-21 DIAGNOSIS — E785 Hyperlipidemia, unspecified: Secondary | ICD-10-CM

## 2019-05-21 DIAGNOSIS — Z95820 Peripheral vascular angioplasty status with implants and grafts: Secondary | ICD-10-CM

## 2019-05-21 DIAGNOSIS — I10 Essential (primary) hypertension: Secondary | ICD-10-CM | POA: Diagnosis not present

## 2019-05-21 DIAGNOSIS — Z23 Encounter for immunization: Secondary | ICD-10-CM

## 2019-05-21 DIAGNOSIS — R0602 Shortness of breath: Secondary | ICD-10-CM | POA: Diagnosis not present

## 2019-05-21 DIAGNOSIS — Z8249 Family history of ischemic heart disease and other diseases of the circulatory system: Secondary | ICD-10-CM

## 2019-05-21 DIAGNOSIS — I249 Acute ischemic heart disease, unspecified: Secondary | ICD-10-CM | POA: Diagnosis not present

## 2019-05-21 DIAGNOSIS — Z79899 Other long term (current) drug therapy: Secondary | ICD-10-CM

## 2019-05-21 DIAGNOSIS — Z885 Allergy status to narcotic agent status: Secondary | ICD-10-CM

## 2019-05-21 DIAGNOSIS — I2119 ST elevation (STEMI) myocardial infarction involving other coronary artery of inferior wall: Secondary | ICD-10-CM | POA: Diagnosis not present

## 2019-05-21 DIAGNOSIS — I2121 ST elevation (STEMI) myocardial infarction involving left circumflex coronary artery: Secondary | ICD-10-CM | POA: Diagnosis not present

## 2019-05-21 DIAGNOSIS — I2510 Atherosclerotic heart disease of native coronary artery without angina pectoris: Secondary | ICD-10-CM | POA: Diagnosis present

## 2019-05-21 DIAGNOSIS — I213 ST elevation (STEMI) myocardial infarction of unspecified site: Secondary | ICD-10-CM

## 2019-05-21 DIAGNOSIS — I2129 ST elevation (STEMI) myocardial infarction involving other sites: Secondary | ICD-10-CM | POA: Diagnosis present

## 2019-05-21 DIAGNOSIS — R000 Tachycardia, unspecified: Secondary | ICD-10-CM | POA: Diagnosis not present

## 2019-05-21 DIAGNOSIS — F17210 Nicotine dependence, cigarettes, uncomplicated: Secondary | ICD-10-CM | POA: Diagnosis not present

## 2019-05-21 DIAGNOSIS — Z955 Presence of coronary angioplasty implant and graft: Secondary | ICD-10-CM

## 2019-05-21 DIAGNOSIS — Z20828 Contact with and (suspected) exposure to other viral communicable diseases: Secondary | ICD-10-CM | POA: Diagnosis not present

## 2019-05-21 HISTORY — DX: ST elevation (STEMI) myocardial infarction involving other sites: I21.29

## 2019-05-21 HISTORY — PX: CORONARY/GRAFT ACUTE MI REVASCULARIZATION: CATH118305

## 2019-05-21 HISTORY — DX: Peripheral vascular angioplasty status with implants and grafts: Z95.820

## 2019-05-21 HISTORY — PX: LEFT HEART CATH AND CORONARY ANGIOGRAPHY: CATH118249

## 2019-05-21 HISTORY — DX: ST elevation (STEMI) myocardial infarction of unspecified site: I21.3

## 2019-05-21 LAB — COMPREHENSIVE METABOLIC PANEL
ALT: 24 U/L (ref 0–44)
AST: 25 U/L (ref 15–41)
Albumin: 3.9 g/dL (ref 3.5–5.0)
Alkaline Phosphatase: 80 U/L (ref 38–126)
Anion gap: 11 (ref 5–15)
BUN: 10 mg/dL (ref 6–20)
CO2: 25 mmol/L (ref 22–32)
Calcium: 9.2 mg/dL (ref 8.9–10.3)
Chloride: 103 mmol/L (ref 98–111)
Creatinine, Ser: 1.16 mg/dL (ref 0.61–1.24)
GFR calc Af Amer: >60 mL/min (ref >60)
GFR calc non Af Amer: >60 mL/min (ref >60)
Glucose, Bld: 96 mg/dL (ref 70–99)
Potassium: 3.5 mmol/L (ref 3.5–5.1)
Sodium: 139 mmol/L (ref 135–145)
Total Bilirubin: 0.4 mg/dL (ref 0.3–1.2)
Total Protein: 7.1 g/dL (ref 6.5–8.1)

## 2019-05-21 LAB — CBC WITH DIFFERENTIAL/PLATELET
Abs Immature Granulocytes: 0.04 10*3/uL (ref 0.00–0.07)
Basophils Absolute: 0.1 10*3/uL (ref 0.0–0.1)
Basophils Relative: 1 %
Eosinophils Absolute: 0.4 10*3/uL (ref 0.0–0.5)
Eosinophils Relative: 3 %
HCT: 47.3 % (ref 39.0–52.0)
Hemoglobin: 16.7 g/dL (ref 13.0–17.0)
Immature Granulocytes: 0 %
Lymphocytes Relative: 22 %
Lymphs Abs: 3.2 10*3/uL (ref 0.7–4.0)
MCH: 30.5 pg (ref 26.0–34.0)
MCHC: 35.3 g/dL (ref 30.0–36.0)
MCV: 86.5 fL (ref 80.0–100.0)
Monocytes Absolute: 1.1 10*3/uL — ABNORMAL HIGH (ref 0.1–1.0)
Monocytes Relative: 7 %
Neutro Abs: 10.2 10*3/uL — ABNORMAL HIGH (ref 1.7–7.7)
Neutrophils Relative %: 67 %
Platelets: 352 10*3/uL (ref 150–400)
RBC: 5.47 MIL/uL (ref 4.22–5.81)
RDW: 12 % (ref 11.5–15.5)
WBC: 15.1 10*3/uL — ABNORMAL HIGH (ref 4.0–10.5)
nRBC: 0 % (ref 0.0–0.2)

## 2019-05-21 LAB — LIPID PANEL
Cholesterol: 228 mg/dL — ABNORMAL HIGH (ref 0–200)
HDL: 36 mg/dL — ABNORMAL LOW (ref >40)
LDL Cholesterol: 130 mg/dL — ABNORMAL HIGH (ref 0–99)
Total CHOL/HDL Ratio: 6.3 RATIO
Triglycerides: 311 mg/dL — ABNORMAL HIGH (ref <150)
VLDL: 62 mg/dL — ABNORMAL HIGH (ref 0–40)

## 2019-05-21 LAB — RESPIRATORY PANEL BY RT PCR (FLU A&B, COVID)
Influenza A by PCR: NEGATIVE
Influenza B by PCR: NEGATIVE
SARS Coronavirus 2 by RT PCR: NEGATIVE

## 2019-05-21 LAB — I-STAT CHEM 8, ED
BUN: 9 mg/dL (ref 6–20)
Calcium, Ion: 1.13 mmol/L — ABNORMAL LOW (ref 1.15–1.40)
Chloride: 101 mmol/L (ref 98–111)
Creatinine, Ser: 1.1 mg/dL (ref 0.61–1.24)
Glucose, Bld: 97 mg/dL (ref 70–99)
HCT: 49 % (ref 39.0–52.0)
Hemoglobin: 16.7 g/dL (ref 13.0–17.0)
Potassium: 3.5 mmol/L (ref 3.5–5.1)
Sodium: 139 mmol/L (ref 135–145)
TCO2: 26 mmol/L (ref 22–32)

## 2019-05-21 LAB — APTT: aPTT: 28 seconds (ref 24–36)

## 2019-05-21 LAB — TROPONIN I (HIGH SENSITIVITY): Troponin I (High Sensitivity): 540 ng/L (ref <18)

## 2019-05-21 LAB — POC SARS CORONAVIRUS 2 AG -  ED: SARS Coronavirus 2 Ag: NEGATIVE

## 2019-05-21 LAB — PROTIME-INR
INR: 0.9 (ref 0.8–1.2)
Prothrombin Time: 11.9 seconds (ref 11.4–15.2)

## 2019-05-21 SURGERY — CORONARY/GRAFT ACUTE MI REVASCULARIZATION
Anesthesia: LOCAL

## 2019-05-21 MED ORDER — HEPARIN SODIUM (PORCINE) 1000 UNIT/ML IJ SOLN
INTRAMUSCULAR | Status: AC
  Filled 2019-05-21: qty 2

## 2019-05-21 MED ORDER — TIROFIBAN (AGGRASTAT) BOLUS VIA INFUSION
INTRAVENOUS | Status: DC | PRN
  Administered 2019-05-21: 3085 ug via INTRAVENOUS

## 2019-05-21 MED ORDER — ONDANSETRON HCL 4 MG/2ML IJ SOLN
INTRAMUSCULAR | Status: DC | PRN
  Administered 2019-05-21: 4 mg via INTRAVENOUS

## 2019-05-21 MED ORDER — SODIUM CHLORIDE 0.9 % IV SOLN
INTRAVENOUS | Status: AC | PRN
  Administered 2019-05-21: 10 mL/h via INTRAVENOUS

## 2019-05-21 MED ORDER — MIDAZOLAM HCL 2 MG/2ML IJ SOLN
INTRAMUSCULAR | Status: AC
  Filled 2019-05-21: qty 2

## 2019-05-21 MED ORDER — LIDOCAINE HCL (PF) 1 % IJ SOLN
INTRAMUSCULAR | Status: DC | PRN
  Administered 2019-05-21: 2 mL

## 2019-05-21 MED ORDER — ONDANSETRON HCL 4 MG/2ML IJ SOLN
INTRAMUSCULAR | Status: AC
  Filled 2019-05-21: qty 2

## 2019-05-21 MED ORDER — TICAGRELOR 90 MG PO TABS
ORAL_TABLET | ORAL | Status: AC
  Filled 2019-05-21: qty 2

## 2019-05-21 MED ORDER — TIROFIBAN HCL IN NACL 5-0.9 MG/100ML-% IV SOLN
INTRAVENOUS | Status: AC | PRN
  Administered 2019-05-21: 0.15 ug/kg/min via INTRAVENOUS

## 2019-05-21 MED ORDER — TICAGRELOR 90 MG PO TABS
ORAL_TABLET | ORAL | Status: DC | PRN
  Administered 2019-05-21: 180 mg via ORAL

## 2019-05-21 MED ORDER — HEPARIN (PORCINE) IN NACL 1000-0.9 UT/500ML-% IV SOLN
INTRAVENOUS | Status: AC
  Filled 2019-05-21: qty 1500

## 2019-05-21 MED ORDER — NITROGLYCERIN 1 MG/10 ML FOR IR/CATH LAB
INTRA_ARTERIAL | Status: DC | PRN
  Administered 2019-05-21: 200 ug via INTRACORONARY
  Administered 2019-05-21: 300 ug via INTRA_ARTERIAL

## 2019-05-21 MED ORDER — IOHEXOL 350 MG/ML SOLN
INTRAVENOUS | Status: DC | PRN
  Administered 2019-05-21: 150 mL via INTRA_ARTERIAL

## 2019-05-21 MED ORDER — ASPIRIN 81 MG PO CHEW
324.0000 mg | CHEWABLE_TABLET | Freq: Once | ORAL | Status: AC
  Administered 2019-05-21: 324 mg via ORAL

## 2019-05-21 MED ORDER — NITROGLYCERIN 1 MG/10 ML FOR IR/CATH LAB
INTRA_ARTERIAL | Status: AC
  Filled 2019-05-21: qty 10

## 2019-05-21 MED ORDER — MIDAZOLAM HCL 2 MG/2ML IJ SOLN
INTRAMUSCULAR | Status: DC | PRN
  Administered 2019-05-21 (×2): 1 mg via INTRAVENOUS

## 2019-05-21 MED ORDER — ASPIRIN 81 MG PO CHEW
CHEWABLE_TABLET | ORAL | Status: AC
  Administered 2019-05-22: 81 mg via ORAL
  Filled 2019-05-21: qty 4

## 2019-05-21 MED ORDER — FENTANYL CITRATE (PF) 100 MCG/2ML IJ SOLN
INTRAMUSCULAR | Status: DC | PRN
  Administered 2019-05-21 (×2): 25 ug via INTRAVENOUS

## 2019-05-21 MED ORDER — VERAPAMIL HCL 2.5 MG/ML IV SOLN
INTRAVENOUS | Status: AC
  Filled 2019-05-21: qty 2

## 2019-05-21 MED ORDER — SODIUM CHLORIDE 0.9 % IV SOLN: INTRAVENOUS | Status: DC

## 2019-05-21 MED ORDER — TIROFIBAN HCL IN NACL 5-0.9 MG/100ML-% IV SOLN
INTRAVENOUS | Status: AC
  Filled 2019-05-21: qty 100

## 2019-05-21 MED ORDER — HEPARIN SODIUM (PORCINE) 5000 UNIT/ML IJ SOLN
4000.0000 [IU] | Freq: Once | INTRAMUSCULAR | Status: AC
  Administered 2019-05-21: 4000 [IU] via INTRAVENOUS

## 2019-05-21 MED ORDER — HEPARIN SODIUM (PORCINE) 5000 UNIT/ML IJ SOLN
60.0000 [IU]/kg | Freq: Once | INTRAMUSCULAR | Status: DC
  Administered 2019-05-21: 4000 [IU] via INTRAVENOUS

## 2019-05-21 MED ORDER — LIDOCAINE HCL (PF) 1 % IJ SOLN
INTRAMUSCULAR | Status: AC
  Filled 2019-05-21: qty 30

## 2019-05-21 MED ORDER — VERAPAMIL HCL 2.5 MG/ML IV SOLN
INTRAVENOUS | Status: DC | PRN
  Administered 2019-05-21: 10 mL via INTRA_ARTERIAL

## 2019-05-21 MED ORDER — HEPARIN SODIUM (PORCINE) 1000 UNIT/ML IJ SOLN
INTRAMUSCULAR | Status: DC | PRN
  Administered 2019-05-21: 11000 [IU] via INTRAVENOUS
  Administered 2019-05-21: 3000 [IU] via INTRAVENOUS

## 2019-05-21 MED ORDER — FENTANYL CITRATE (PF) 100 MCG/2ML IJ SOLN
INTRAMUSCULAR | Status: AC
  Filled 2019-05-21: qty 2

## 2019-05-21 MED ORDER — HEPARIN SODIUM (PORCINE) 5000 UNIT/ML IJ SOLN
INTRAMUSCULAR | Status: AC
  Filled 2019-05-21: qty 1

## 2019-05-21 SURGICAL SUPPLY — 19 items
BALLN SAPPHIRE 2.0X10 (BALLOONS) ×2
BALLOON SAPPHIRE 2.0X10 (BALLOONS) ×1 IMPLANT
CATH INFINITI 5 FR JL3.5 (CATHETERS) ×2 IMPLANT
CATH LAUNCHER 5F EBU3.5 (CATHETERS) ×2 IMPLANT
CATH LAUNCHER 6FR JR4 (CATHETERS) ×2 IMPLANT
CATHETER LAUNCHER 6FR MP1 (CATHETERS) ×2 IMPLANT
DEVICE RAD COMP TR BAND LRG (VASCULAR PRODUCTS) ×2 IMPLANT
GLIDESHEATH SLEND SS 6F .021 (SHEATH) ×2 IMPLANT
GUIDEWIRE INQWIRE 1.5J.035X260 (WIRE) ×1 IMPLANT
HOVERMATT SINGLE USE (MISCELLANEOUS) ×2 IMPLANT
INQWIRE 1.5J .035X260CM (WIRE) ×2
KIT HEART LEFT (KITS) ×2 IMPLANT
KIT HEMO VALVE WATCHDOG (MISCELLANEOUS) ×2 IMPLANT
PACK CARDIAC CATHETERIZATION (CUSTOM PROCEDURE TRAY) ×2 IMPLANT
STENT RESOLUTE ONYX 2.0X22 (Permanent Stent) ×2 IMPLANT
TRANSDUCER W/STOPCOCK (MISCELLANEOUS) ×2 IMPLANT
TUBING CIL FLEX 10 FLL-RA (TUBING) ×2 IMPLANT
WIRE ASAHI PROWATER 180CM (WIRE) ×2 IMPLANT
WIRE HI TORQ BMW 190CM (WIRE) ×2 IMPLANT

## 2019-05-21 NOTE — ED Triage Notes (Signed)
Patient reports left chest chest with SOB onset today , pain radiating to left arm , denies emesis or diaphoresis .

## 2019-05-21 NOTE — ED Notes (Addendum)
Family member, Juliann Pulse, would like to be notified when pt gets room. [phone]

## 2019-05-21 NOTE — ED Provider Notes (Signed)
[place] EMERGENCY DEPARTMENT Provider Note   CSN: [id number] Arrival date & time: 05/21/19  2149     History   Chief Complaint Chief Complaint  Patient presents with  . Chest Pain    STEMI    HPI [facility]. is a 48 y.o. male.     Patient c/o acute onset mid chest pressure/gas like indigestion feeling 'in the middle of the day', at rest, lasted an hour or so and went away. This evening, at home, after showering, felt similar pain/discomfort, mid chest, radiating to left arm, with sob. Symptoms acute onset, moderate, persistent, constant, dull/pressure. States similar milder/briefer pain in past. No prior cardiac cath. +fam hx cad. No pleuritic pain. No leg pain or swelling. No chest wall injury or strain. No cough or uri symptoms. No fever or chills. No known covid + exposure.   The history is provided by the patient.  Chest Pain Associated symptoms: shortness of breath   Associated symptoms: no abdominal pain, no back pain, no cough, no fever, no headache and no palpitations     Past Medical History:  Diagnosis Date  . Hypertension     There are no active problems to display for this patient.   Past Surgical History:  Procedure Laterality Date  . KNEE ARTHROSCOPY    . SHOULDER SURGERY          Home Medications    Prior to Admission medications   Medication Sig Start Date End Date Taking? Authorizing Provider  allopurinol (ZYLOPRIM) 300 MG tablet  07/06/18   [provider]  diclofenac (VOLTAREN) 75 MG EC tablet Take 1 tablet (75 mg total) by mouth 2 (two) times daily. 06/28/18   Wallene Huh, DPM  meloxicam (MOBIC) 15 MG tablet  05/14/18   [provider]  metaxalone (SKELAXIN) 800 MG tablet  05/14/18   [provider]  methocarbamol (ROBAXIN) 500 MG tablet Take 1 tablet (500 mg total) by mouth 2 (two) times daily. 03/24/18   Fransico Meadow, PA-C  MITIGARE 0.6 MG CAPS  07/06/18   [provider]   nabumetone (RELAFEN) 750 MG tablet  06/08/18   [provider]    Family History No family history on file.  Social History Social History   Tobacco Use  . Smoking status: Current Every Day Smoker    Packs/day: 1.00    Types: Cigarettes  . Smokeless tobacco: Current User    Types: Snuff  Substance Use Topics  . Alcohol use: No  . Drug use: No     Allergies   Morphine and related   Review of Systems Review of Systems  Constitutional: Negative for fever.  HENT: Negative for sore throat.   Eyes: Negative for redness.  Respiratory: Positive for shortness of breath. Negative for cough.   Cardiovascular: Positive for chest pain. Negative for palpitations and leg swelling.  Gastrointestinal: Negative for abdominal pain.  Genitourinary: Negative for flank pain.  Musculoskeletal: Negative for back pain and neck pain.  Skin: Negative for rash.  Neurological: Negative for headaches.  Hematological: Does not bruise/bleed easily.  Psychiatric/Behavioral: Negative for confusion.     Physical Exam Updated Vital Signs BP (!) 156/112 (BP Location: Left Arm)   Pulse (!) 109   Temp 98.3 F (36.8 C) (Oral)   Resp 18   SpO2 97%   Physical Exam Vitals signs and nursing note reviewed.  Constitutional:      Appearance: Normal appearance. He is well-developed.  HENT:  Head: Atraumatic.     Nose: Nose normal.     Mouth/Throat:     Mouth: Mucous membranes are moist.     Pharynx: Oropharynx is clear.  Eyes:     General: No scleral icterus.    Conjunctiva/sclera: Conjunctivae normal.  Neck:     Musculoskeletal: Normal range of motion and neck supple. No neck rigidity.     Trachea: No tracheal deviation.  Cardiovascular:     Rate and Rhythm: Normal rate and regular rhythm.     Pulses: Normal pulses.     Heart sounds: Normal heart sounds. No murmur. No friction rub. No gallop.   Pulmonary:     Effort: Pulmonary effort is normal. No accessory muscle usage or  respiratory distress.     Breath sounds: Normal breath sounds.  Chest:     Chest wall: No tenderness.  Abdominal:     General: Bowel sounds are normal. There is no distension.     Palpations: Abdomen is soft. There is no mass.     Tenderness: There is no abdominal tenderness. There is no guarding or rebound.     Hernia: No hernia is present.  Genitourinary:    Comments: No cva tenderness. Musculoskeletal:        General: No swelling or tenderness.     Right lower leg: No edema.     Left lower leg: No edema.  Skin:    General: Skin is warm and dry.     Findings: No rash.  Neurological:     Mental Status: He is alert.     Comments: Alert, speech clear. Motor/sens grossly intact bil.   Psychiatric:        Mood and Affect: Mood normal.      ED Treatments / Results  Labs (all labs ordered are listed, but only abnormal results are displayed) Results for orders placed or performed during the hospital encounter of 05/21/19  CBC with Differential/Platelet  Result Value Ref Range   WBC 15.1 (H) 4.0 - 10.5 K/uL   RBC 5.47 4.22 - 5.81 MIL/uL   Hemoglobin 16.7 13.0 - 17.0 g/dL   HCT 47.3 39.0 - 52.0 %   MCV 86.5 80.0 - 100.0 fL   MCH 30.5 26.0 - 34.0 pg   MCHC 35.3 30.0 - 36.0 g/dL   RDW 12.0 11.5 - 15.5 %   Platelets 352 150 - 400 K/uL   nRBC 0.0 0.0 - 0.2 %   Neutrophils Relative % 67 %   Neutro Abs 10.2 (H) 1.7 - 7.7 K/uL   Lymphocytes Relative 22 %   Lymphs Abs 3.2 0.7 - 4.0 K/uL   Monocytes Relative 7 %   Monocytes Absolute 1.1 (H) 0.1 - 1.0 K/uL   Eosinophils Relative 3 %   Eosinophils Absolute 0.4 0.0 - 0.5 K/uL   Basophils Relative 1 %   Basophils Absolute 0.1 0.0 - 0.1 K/uL   Immature Granulocytes 0 %   Abs Immature Granulocytes 0.04 0.00 - 0.07 K/uL  Protime-INR  Result Value Ref Range   Prothrombin Time 11.9 11.4 - 15.2 seconds   INR 0.9 0.8 - 1.2  APTT  Result Value Ref Range   aPTT 28 24 - 36 seconds  POC SARS Coronavirus 2 Ag-ED - Nasal Swab (BD Veritor  Kit)  Result Value Ref Range   SARS Coronavirus 2 Ag NEGATIVE NEGATIVE  I-stat chem 8, ED (not at [facility] or [facility])  Result Value Ref Range   Sodium 139 135 - 145  mmol/L   Potassium 3.5 3.5 - 5.1 mmol/L   Chloride 101 98 - 111 mmol/L   BUN 9 6 - 20 mg/dL   Creatinine, Ser 1.10 0.61 - 1.24 mg/dL   Glucose, Bld 97 70 - 99 mg/dL   Calcium, Ion 1.13 (L) 1.15 - 1.40 mmol/L   TCO2 26 22 - 32 mmol/L   Hemoglobin 16.7 13.0 - 17.0 g/dL   HCT 49.0 39.0 - 52.0 %    EKG EKG Interpretation  Date/Time:  Tuesday May 21 2019 21:54:08 EST Ventricular Rate:  111 PR Interval:  150 QRS Duration: 72 QT Interval:  330 QTC Calculation: 448 R Axis:   -2 Text Interpretation: Sinus tachycardia ** ** ACUTE MI / STEMI ** ** `st elev inferior/laterally, no old ecg to compare Confirmed by Lajean Saver [phone]) on 05/21/2019 10:03:04 PM   Radiology No results found.  Procedures Procedures (including critical care time)  Medications Ordered in ED Medications  0.9 %  sodium chloride infusion (has no administration in time range)  aspirin chewable tablet 324 mg (has no administration in time range)  heparin injection 60 Units/kg (has no administration in time range)     Initial Impression / Assessment and Plan / ED Course  I have reviewed the triage vital signs and the nursing notes.  Pertinent labs & imaging results that were available during my care of the patient were reviewed by me and considered in my medical decision making (see chart for details).  Iv ns. Continuous pulse ox and monitor. Stat ecg. Stat pcxr.   Triage ECG reviewed by me - code stemi immediately activated.   Cardiology/stemi doc consulted - discussed pt with Dr Irish Lack - is coming to see and take to cath lab.  Asa and heparin per pharmacy consult - bolus given prior to transfer to cath lab.  Reviewed nursing notes and prior charts for additional history.   Labs pending. Awaiting cath lab.   Labs reviewed/interpreted  by me - chem normal. Trop pending.   Cardiology in ED with patient. Pain improved but persists.   CRITICAL CARE RE: STEMI/Acute coronary syndrome, emergent cardiology consultation with cath lab activation Performed by: Mirna Mires Total critical care time: 35 minutes Critical care time was exclusive of separately billable procedures and treating other patients. Critical care was necessary to treat or prevent imminent or life-threatening deterioration. Critical care was time spent personally by me on the following activities: development of treatment plan with patient and/or surrogate as well as nursing, discussions with consultants, evaluation of patient's response to treatment, examination of patient, obtaining history from patient or surrogate, ordering and performing treatments and interventions, ordering and review of laboratory studies, ordering and review of radiographic studies, pulse oximetry and re-evaluation of patient's condition.   Final Clinical Impressions(s) / ED Diagnoses   Final diagnoses:  ST elevation myocardial infarction (STEMI), unspecified artery [facility])  ACS (acute coronary syndrome) [facility])    ED Discharge Orders    None       Lajean Saver, MD 05/21/19 2253

## 2019-05-22 ENCOUNTER — Encounter (HOSPITAL_COMMUNITY): Payer: Self-pay | Admitting: Interventional Cardiology

## 2019-05-22 ENCOUNTER — Inpatient Hospital Stay (HOSPITAL_COMMUNITY): Payer: BC Managed Care – PPO

## 2019-05-22 DIAGNOSIS — I249 Acute ischemic heart disease, unspecified: Secondary | ICD-10-CM | POA: Diagnosis not present

## 2019-05-22 DIAGNOSIS — I2119 ST elevation (STEMI) myocardial infarction involving other coronary artery of inferior wall: Principal | ICD-10-CM

## 2019-05-22 LAB — CBC
HCT: 43.7 % (ref 39.0–52.0)
HCT: 42.8 % (ref 39.0–52.0)
Hemoglobin: 15.2 g/dL (ref 13.0–17.0)
Hemoglobin: 15.1 g/dL (ref 13.0–17.0)
MCH: 30.6 pg (ref 26.0–34.0)
MCH: 30.8 pg (ref 26.0–34.0)
MCHC: 34.8 g/dL (ref 30.0–36.0)
MCHC: 35.3 g/dL (ref 30.0–36.0)
MCV: 88.1 fL (ref 80.0–100.0)
MCV: 87.2 fL (ref 80.0–100.0)
Platelets: 312 10*3/uL (ref 150–400)
Platelets: 272 10*3/uL (ref 150–400)
RBC: 4.96 MIL/uL (ref 4.22–5.81)
RBC: 4.91 MIL/uL (ref 4.22–5.81)
RDW: 12.4 % (ref 11.5–15.5)
RDW: 12.3 % (ref 11.5–15.5)
WBC: 14.3 10*3/uL — ABNORMAL HIGH (ref 4.0–10.5)
WBC: 10.5 10*3/uL (ref 4.0–10.5)
nRBC: 0 % (ref 0.0–0.2)
nRBC: 0 % (ref 0.0–0.2)

## 2019-05-22 LAB — BASIC METABOLIC PANEL
Anion gap: 11 (ref 5–15)
BUN: 6 mg/dL (ref 6–20)
CO2: 24 mmol/L (ref 22–32)
Calcium: 8.7 mg/dL — ABNORMAL LOW (ref 8.9–10.3)
Chloride: 102 mmol/L (ref 98–111)
Creatinine, Ser: 1.07 mg/dL (ref 0.61–1.24)
GFR calc Af Amer: >60 mL/min (ref >60)
GFR calc non Af Amer: >60 mL/min (ref >60)
Glucose, Bld: 152 mg/dL — ABNORMAL HIGH (ref 70–99)
Potassium: 3.9 mmol/L (ref 3.5–5.1)
Sodium: 137 mmol/L (ref 135–145)

## 2019-05-22 LAB — ECHOCARDIOGRAM COMPLETE
Height: 69 in
Weight: 4352 oz

## 2019-05-22 LAB — POCT I-STAT, CHEM 8
BUN: 8 mg/dL (ref 6–20)
Calcium, Ion: 1.14 mmol/L — ABNORMAL LOW (ref 1.15–1.40)
Chloride: 101 mmol/L (ref 98–111)
Creatinine, Ser: 1 mg/dL (ref 0.61–1.24)
Glucose, Bld: 143 mg/dL — ABNORMAL HIGH (ref 70–99)
HCT: 44 % (ref 39.0–52.0)
Hemoglobin: 15 g/dL (ref 13.0–17.0)
Potassium: 3.5 mmol/L (ref 3.5–5.1)
Sodium: 139 mmol/L (ref 135–145)
TCO2: 25 mmol/L (ref 22–32)

## 2019-05-22 LAB — MRSA PCR SCREENING: MRSA by PCR: NEGATIVE

## 2019-05-22 LAB — GLUCOSE, CAPILLARY: Glucose-Capillary: 298 mg/dL — ABNORMAL HIGH (ref 70–99)

## 2019-05-22 LAB — POCT ACTIVATED CLOTTING TIME: Activated Clotting Time: 285 seconds

## 2019-05-22 LAB — TROPONIN I (HIGH SENSITIVITY): Troponin I (High Sensitivity): 4923 ng/L (ref <18)

## 2019-05-22 MED ORDER — ASPIRIN 81 MG PO CHEW
81.0000 mg | CHEWABLE_TABLET | Freq: Every day | ORAL | Status: DC
  Administered 2019-05-23: 81 mg via ORAL
  Filled 2019-05-22 (×2): qty 1

## 2019-05-22 MED ORDER — HYDRALAZINE HCL 20 MG/ML IJ SOLN: 10.0000 mg | INTRAMUSCULAR | Status: AC | PRN

## 2019-05-22 MED ORDER — SODIUM CHLORIDE 0.9 % IV SOLN: INTRAVENOUS | Status: AC

## 2019-05-22 MED ORDER — TICAGRELOR 90 MG PO TABS
90.0000 mg | ORAL_TABLET | Freq: Two times a day (BID) | ORAL | Status: DC
  Administered 2019-05-22 – 2019-05-23 (×3): 90 mg via ORAL
  Filled 2019-05-22 (×3): qty 1

## 2019-05-22 MED ORDER — INFLUENZA VAC SPLIT QUAD 0.5 ML IM SUSY
0.5000 mL | PREFILLED_SYRINGE | INTRAMUSCULAR | Status: AC
  Administered 2019-05-23: 0.5 mL via INTRAMUSCULAR
  Filled 2019-05-22: qty 0.5

## 2019-05-22 MED ORDER — CALCIUM CARBONATE ANTACID 500 MG PO CHEW
1.0000 | CHEWABLE_TABLET | Freq: Once | ORAL | Status: AC
  Administered 2019-05-22: 200 mg via ORAL
  Filled 2019-05-22: qty 1

## 2019-05-22 MED ORDER — ONDANSETRON HCL 4 MG/2ML IJ SOLN: 4.0000 mg | Freq: Four times a day (QID) | INTRAMUSCULAR | Status: DC | PRN

## 2019-05-22 MED ORDER — CHLORHEXIDINE GLUCONATE CLOTH 2 % EX PADS
6.0000 | MEDICATED_PAD | Freq: Every day | CUTANEOUS | Status: DC
  Administered 2019-05-22: 6 via TOPICAL

## 2019-05-22 MED ORDER — SODIUM CHLORIDE 0.9% FLUSH: 3.0000 mL | INTRAVENOUS | Status: DC | PRN

## 2019-05-22 MED ORDER — SODIUM CHLORIDE 0.9 % IV SOLN: 250.0000 mL | INTRAVENOUS | Status: DC | PRN

## 2019-05-22 MED ORDER — LABETALOL HCL 5 MG/ML IV SOLN: 10.0000 mg | INTRAVENOUS | Status: AC | PRN

## 2019-05-22 MED ORDER — SODIUM CHLORIDE 0.9% FLUSH
3.0000 mL | Freq: Two times a day (BID) | INTRAVENOUS | Status: DC
  Administered 2019-05-22: 3 mL via INTRAVENOUS

## 2019-05-22 MED ORDER — METOPROLOL TARTRATE 12.5 MG HALF TABLET
12.5000 mg | ORAL_TABLET | Freq: Two times a day (BID) | ORAL | Status: DC
  Administered 2019-05-22 (×2): 12.5 mg via ORAL
  Filled 2019-05-22 (×2): qty 1

## 2019-05-22 MED ORDER — ATORVASTATIN CALCIUM 80 MG PO TABS
80.0000 mg | ORAL_TABLET | Freq: Every day | ORAL | Status: DC
  Administered 2019-05-22 (×2): 80 mg via ORAL
  Filled 2019-05-22 (×2): qty 1

## 2019-05-22 MED ORDER — ACETAMINOPHEN 325 MG PO TABS: 650.0000 mg | ORAL_TABLET | ORAL | Status: DC | PRN

## 2019-05-22 MED ORDER — FAMOTIDINE IN NACL 20-0.9 MG/50ML-% IV SOLN
20.0000 mg | Freq: Once | INTRAVENOUS | Status: AC
  Administered 2019-05-22: 20 mg via INTRAVENOUS
  Filled 2019-05-22: qty 50

## 2019-05-22 MED FILL — Heparin Sod (Porcine)-NaCl IV Soln 1000 Unit/500ML-0.9%: INTRAVENOUS | Qty: 1500 | Status: AC

## 2019-05-22 NOTE — Progress Notes (Signed)
Patient transferred from 2h at 1715hrs. Oriented to unit and plan of care for shift.

## 2019-05-22 NOTE — Progress Notes (Signed)
CARDIAC REHAB PHASE I   PRE:  Rate/Rhythm: 89 SR    BP: sitting 113/73    SaO2: 99 RA  MODE:  Ambulation: 370 ft   POST:  Rate/Rhythm: 93 SR    BP: sitting 150/101     SaO2: 99 RA  Tolerated well, no c/o. BP elevated. Ed completed with good reception. Discussed MI, stent, restrictions, Brilinta, smoking cessation, diet, exercise, NTG, and CRPII. Will refer to [place]. He is motivated to take meds, quit smoking, watch diet. [place], ACSM 05/22/2019 2:40 PM

## 2019-05-22 NOTE — H&P (Signed)
Cardiology Admission History and Physical:   Patient ID: Timothy [name]. MRN: [id number]; DOB: 10-16-1970   Admission date: 05/21/2019  Primary Care Provider: Irena Reichmann, DO Primary Cardiologist: No primary care provider on file. [place] Primary Electrophysiologist:  None   Chief Complaint:  Chest pain   History of Present Illness:    Timothy [name]. is a 48 y.o. male with history of HTN admitted last night with chest pain. EKG with inferolateral STEMI. Dr. Eldridge Dace performed emergent cardiac cath and he was found to have occlusion of the second OM branch treated with a drug eluting stent. He has done well following the stent placement.   He has no chest pain this am. No dyspnea. Feels much better.   Past Medical History:  Diagnosis Date  . Hypertension     Past Surgical History:  Procedure Laterality Date  . CORONARY/GRAFT ACUTE MI REVASCULARIZATION N/A 05/21/2019   Procedure: Coronary/Graft Acute MI Revascularization;  Surgeon: Corky Crafts, MD;  Location: [facility] INVASIVE CV LAB;  Service: Cardiovascular;  Laterality: N/A;  . KNEE ARTHROSCOPY    . LEFT HEART CATH AND CORONARY ANGIOGRAPHY N/A 05/21/2019   Procedure: LEFT HEART CATH AND CORONARY ANGIOGRAPHY;  Surgeon: Corky Crafts, MD;  Location: [facility] INVASIVE CV LAB;  Service: Cardiovascular;  Laterality: N/A;  . SHOULDER SURGERY       Medications Prior to Admission: Prior to Admission medications   Medication Sig Start Date End Date Taking? Authorizing Provider  allopurinol (ZYLOPRIM) 300 MG tablet  07/06/18   [provider]  diclofenac (VOLTAREN) 75 MG EC tablet Take 1 tablet (75 mg total) by mouth 2 (two) times daily. 06/28/18   Lenn Sink, DPM  meloxicam (MOBIC) 15 MG tablet  05/14/18   [provider]  metaxalone (SKELAXIN) 800 MG tablet  05/14/18   [provider]  methocarbamol (ROBAXIN) 500 MG tablet Take 1 tablet (500 mg total) by mouth 2 (two) times daily. 03/24/18   Elson Areas, PA-C  MITIGARE 0.6 MG CAPS  07/06/18   [provider]  nabumetone (RELAFEN) 750 MG tablet  06/08/18   [provider]    . aspirin      . aspirin  81 mg Oral Daily  . atorvastatin  80 mg Oral q1800  . Chlorhexidine Gluconate Cloth  6 each Topical Daily  . heparin      . metoprolol tartrate  12.5 mg Oral BID  . sodium chloride flush  3 mL Intravenous Q12H  . ticagrelor  90 mg Oral BID    Allergies:    Allergies  Allergen Reactions  . Morphine And Related Hives    Social History:   Social History   Socioeconomic History  . Marital status: Widowed    Spouse name: Not on file  . Number of children: Not on file  . Years of education: Not on file  . Highest education level: Not on file  Occupational History  . Not on file  Social Needs  . Financial resource strain: Not on file  . Food insecurity    Worry: Not on file    Inability: Not on file  . Transportation needs    Medical: Not on file    Non-medical: Not on file  Tobacco Use  . Smoking status: Current Every Day Smoker    Packs/day: 1.00    Types: Cigarettes  . Smokeless tobacco: Current User    Types: Snuff  Substance and Sexual Activity  . Alcohol use:  No  . Drug use: No  . Sexual activity: Yes  Lifestyle  . Physical activity    Days per week: Not on file    Minutes per session: Not on file  . Stress: Not on file  Relationships  . Social Musicianconnections    Talks on phone: Not on file    Gets together: Not on file    Attends religious service: Not on file    Active member of club or organization: Not on file    Attends meetings of clubs or organizations: Not on file    Relationship status: Not on file  . Intimate partner violence    Fear of current or ex partner: Not on file    Emotionally abused: Not on file    Physically abused: Not on file    Forced sexual activity: Not on file  Other Topics Concern  . Not on file  Social History Narrative  . Not on file    Family  History:   The patient's family history is not on file.    ROS:  Please see the history of present illness.  All other ROS reviewed and negative.     Physical Exam/Data:   Vitals:   05/22/19 0400 05/22/19 0500 05/22/19 0600 05/22/19 0700  BP: 128/83 130/75 (!) 140/92 (!) 146/84  Pulse:  74 67 83  Resp: 20 (!) 25 (!) 25 16  Temp: 98 F (36.7 C)     TempSrc: Oral     SpO2: 99% 98% 97% 99%  Weight:      Height:        Intake/Output Summary (Last 24 hours) at 05/22/2019 0747 Last data filed at 05/22/2019 0700 Gross per 24 hour  Intake 464.66 ml  Output 275 ml  Net 189.66 ml   Last 3 Weights 05/21/2019 03/24/2018  Weight (lbs) 272 lb 260 lb  Weight (kg) 123.378 kg 117.935 kg     Body mass index is 40.17 kg/m.  General:  Well nourished, well developed, in no acute distress HEENT: normal Lymph: no adenopathy Neck: no JVD Endocrine:  No thryomegaly Vascular: No carotid bruits; FA pulses 2+ bilaterally without bruits  Cardiac:  normal S1, S2; RRR; no murmur  Lungs:  clear to auscultation bilaterally, no wheezing, rhonchi or rales  Abd: soft, nontender, no hepatomegaly  Ext: no LE edema Musculoskeletal:  No deformities, BUE and BLE strength normal and equal Skin: warm and dry  Neuro:  CNs 2-12 intact, no focal abnormalities noted Psych:  Normal affect    EKG:  The ECG that was done this am was personally reviewed and demonstrates sinus, no ischemic changes  Relevant CV Studies:  Cardiac cath 05/21/19:  Mid LAD lesion is 50% stenosed.  Mid RCA lesion is 25% stenosed.  2nd Mrg lesion is 99% stenosed.  A drug-eluting stent was successfully placed using a STENT RESOLUTE ONYX 2.0X22.  Post intervention, there is a 0% residual stenosis.  The left ventricular systolic function is normal.  LV end diastolic pressure is normal.  The left ventricular ejection fraction is 50-55% by visual estimate.  There is no aortic valve stenosis.  Short aortic arch making JL3.5  difficult to use. EBU 3 Guide catheter was used to engage left main/LAD. EBU 3.5 may be a better choice if PCI was needed.  Laboratory Data:  High Sensitivity Troponin:   Recent Labs  Lab 05/21/19 2204 05/22/19 0302  TROPONINIHS 540* 4,923*      Chemistry Recent Labs  Lab 05/21/19  2204 05/21/19 2215 05/22/19 0302  NA 139 139 137  K 3.5 3.5 3.9  CL 103 101 102  CO2 25  --  24  GLUCOSE 96 97 152*  BUN 10 9 6   CREATININE 1.16 1.10 1.07  CALCIUM 9.2  --  8.7*  GFRNONAA >60  --  >60  GFRAA >60  --  >60  ANIONGAP 11  --  11    Recent Labs  Lab 05/21/19 2204  PROT 7.1  ALBUMIN 3.9  AST 25  ALT 24  ALKPHOS 80  BILITOT 0.4   Hematology Recent Labs  Lab 05/21/19 2204 05/21/19 2215 05/22/19 0302  WBC 15.1*  --  14.3*  RBC 5.47  --  4.96  HGB 16.7 16.7 15.2  HCT 47.3 49.0 43.7  MCV 86.5  --  88.1  MCH 30.5  --  30.6  MCHC 35.3  --  34.8  RDW 12.0  --  12.4  PLT 352  --  312   BNPNo results for input(s): BNP, PROBNP in the last 168 hours.  DDimer No results for input(s): DDIMER in the last 168 hours.   Radiology/Studies:  No results found.  Assessment and Plan:   1. CAD/Inferolateral STEMI: Pt presented last night with chest pain. A drug eluting stent was placed in the severe stenosis in the second obtuse marginal branch. He is doing well this am. No chest pain. Will continue ASA, Brilinta, statin, beta blocker. Will arrange an echo today. Transfer to telemetry unit.   Severity of Illness: The appropriate patient status for this patient is INPATIENT. Inpatient status is judged to be reasonable and necessary in order to provide the required intensity of service to ensure the patient's safety. The patient's presenting symptoms, physical exam findings, and initial radiographic and laboratory data in the context of their chronic comorbidities is felt to place them at high risk for further clinical deterioration. Furthermore, it is not anticipated that the patient  will be medically stable for discharge from the hospital within 2 midnights of admission. The following factors support the patient status of inpatient.   " The patient's presenting symptoms include chest pain. " The worrisome physical exam findings include ST elevation on EKG " The initial radiographic and laboratory data are worrisome because of EKG changes " The chronic co-morbidities include HTN   * I certify that at the point of admission it is my clinical judgment that the patient will require inpatient hospital care spanning beyond 2 midnights from the point of admission due to high intensity of service, high risk for further deterioration and high frequency of surveillance required.*    For questions or updates, please contact [place] Please consult [email or web address] for contact info under        Signed, Lauree Chandler, MD  05/22/2019 7:47 AM

## 2019-05-22 NOTE — Care Management (Signed)
4801 05-22-19 Benefits check submitted for Brilinta. CM will make patient aware of cost once completed. Bethena Roys, RN,BSN Case Manager [phone]

## 2019-05-22 NOTE — Progress Notes (Signed)
  Echocardiogram 2D Echocardiogram has been performed.  Timothy [name] 05/22/2019, 11:51 AM

## 2019-05-23 ENCOUNTER — Telehealth: Payer: Self-pay | Admitting: Cardiology

## 2019-05-23 DIAGNOSIS — E785 Hyperlipidemia, unspecified: Secondary | ICD-10-CM

## 2019-05-23 DIAGNOSIS — I10 Essential (primary) hypertension: Secondary | ICD-10-CM

## 2019-05-23 LAB — BASIC METABOLIC PANEL
Anion gap: 9 (ref 5–15)
BUN: 7 mg/dL (ref 6–20)
CO2: 27 mmol/L (ref 22–32)
Calcium: 8.8 mg/dL — ABNORMAL LOW (ref 8.9–10.3)
Chloride: 103 mmol/L (ref 98–111)
Creatinine, Ser: 1.22 mg/dL (ref 0.61–1.24)
GFR calc Af Amer: >60 mL/min (ref >60)
GFR calc non Af Amer: >60 mL/min (ref >60)
Glucose, Bld: 127 mg/dL — ABNORMAL HIGH (ref 70–99)
Potassium: 4.3 mmol/L (ref 3.5–5.1)
Sodium: 139 mmol/L (ref 135–145)

## 2019-05-23 MED ORDER — METOPROLOL TARTRATE 25 MG PO TABS: 25.0000 mg | ORAL_TABLET | Freq: Two times a day (BID) | ORAL | 1 refills | Status: DC

## 2019-05-23 MED ORDER — METOPROLOL TARTRATE 25 MG PO TABS
25.0000 mg | ORAL_TABLET | Freq: Two times a day (BID) | ORAL | Status: DC
  Administered 2019-05-23: 25 mg via ORAL
  Filled 2019-05-23: qty 1

## 2019-05-23 MED ORDER — ATORVASTATIN CALCIUM 80 MG PO TABS: 80.0000 mg | ORAL_TABLET | Freq: Every day | ORAL | 1 refills | Status: DC

## 2019-05-23 MED ORDER — NITROGLYCERIN 0.4 MG SL SUBL: 0.4000 mg | SUBLINGUAL_TABLET | SUBLINGUAL | 2 refills | Status: DC | PRN

## 2019-05-23 MED ORDER — LISINOPRIL 2.5 MG PO TABS
2.5000 mg | ORAL_TABLET | Freq: Every day | ORAL | Status: DC
  Administered 2019-05-23: 2.5 mg via ORAL
  Filled 2019-05-23: qty 1

## 2019-05-23 MED ORDER — TICAGRELOR 90 MG PO TABS: 90.0000 mg | ORAL_TABLET | Freq: Two times a day (BID) | ORAL | 1 refills | Status: DC

## 2019-05-23 MED ORDER — LISINOPRIL 2.5 MG PO TABS: 2.5000 mg | ORAL_TABLET | Freq: Every day | ORAL | 1 refills | Status: DC

## 2019-05-23 MED ORDER — ASPIRIN 81 MG PO CHEW: 81.0000 mg | CHEWABLE_TABLET | Freq: Every day | ORAL | 1 refills | Status: DC

## 2019-05-23 MED FILL — METOPROLOL TARTRATE 25 MG T: 25 | 30 days supply | Qty: 60 | Fill #0

## 2019-05-23 MED FILL — NITROGLYCERIN 0.4 MG TAB SL: 0.4 | 8 days supply | Qty: 25 | Fill #0

## 2019-05-23 MED FILL — BRILINTA 90 MG TABLET: 90 | 30 days supply | Qty: 60 | Fill #0

## 2019-05-23 MED FILL — ASPIRIN LOW DOSE 81 MG CHEW: 81 | 90 days supply | Qty: 90 | Fill #0

## 2019-05-23 MED FILL — LISINOPRIL 2.5 MG TABLET: 2.5 | 30 days supply | Qty: 30 | Fill #0

## 2019-05-23 MED FILL — ATORVASTATIN CALCIUM 80 MG: 80 | 30 days supply | Qty: 30 | Fill #0

## 2019-05-23 NOTE — Progress Notes (Signed)
Progress Note  Patient Name: Timothy [name]. Date of Encounter: 05/23/2019  Primary Cardiologist: Irish Lack  Subjective   No chest pain or dyspnea.   Inpatient Medications    Scheduled Meds: . aspirin  81 mg Oral Daily  . atorvastatin  80 mg Oral q1800  . Chlorhexidine Gluconate Cloth  6 each Topical Daily  . influenza vac split quadrivalent PF  0.5 mL Intramuscular Tomorrow-1000  . metoprolol tartrate  12.5 mg Oral BID  . sodium chloride flush  3 mL Intravenous Q12H  . ticagrelor  90 mg Oral BID   Continuous Infusions: . sodium chloride Stopped (05/22/19 0921)  . sodium chloride     PRN Meds: sodium chloride, acetaminophen, ondansetron (ZOFRAN) IV, sodium chloride flush   Vital Signs    Vitals:   05/22/19 1300 05/22/19 1715 05/22/19 2041 05/23/19 0546  BP: 126/79 126/70 134/81 126/82  Pulse: 74 78 76 75  Resp: 18 18 18 18   Temp:  98.6 F (37 C) 98.4 F (36.9 C) 98.2 F (36.8 C)  TempSrc:  Oral Oral Oral  SpO2: 98% 97% 98% 96%  Weight:      Height:        Intake/Output Summary (Last 24 hours) at 05/23/2019 0850 Last data filed at 05/22/2019 2145 Gross per 24 hour  Intake 1076.08 ml  Output 1000 ml  Net 76.08 ml   Last 3 Weights 05/21/2019 03/24/2018  Weight (lbs) 272 lb 260 lb  Weight (kg) 123.378 kg 117.935 kg      Telemetry    Sinus - Personally Reviewed  ECG    No AM EKG - Personally Reviewed  Physical Exam   GEN: No acute distress.   Neck: No JVD Cardiac: RRR, no murmurs, rubs, or gallops.  Respiratory: Clear to auscultation bilaterally. GI: Soft, nontender, non-distended  MS: No edema; No deformity. Neuro:  Nonfocal  Psych: Normal affect   Labs    High Sensitivity Troponin:   Recent Labs  Lab 05/21/19 2204 05/22/19 0302  TROPONINIHS 540* 4,923*      Chemistry Recent Labs  Lab 05/21/19 2204 05/21/19 2254 05/22/19 0302 05/23/19 0447  NA 139 139 137 139  K 3.5 3.5 3.9 4.3  CL 103 101 102 103  CO2 25  --  24 27  GLUCOSE  96 143* 152* 127*  BUN 10 8 6 7   CREATININE 1.16 1.00 1.07 1.22  CALCIUM 9.2  --  8.7* 8.8*  PROT 7.1  --   --   --   ALBUMIN 3.9  --   --   --   AST 25  --   --   --   ALT 24  --   --   --   ALKPHOS 80  --   --   --   BILITOT 0.4  --   --   --   GFRNONAA >60  --  >60 >60  GFRAA >60  --  >60 >60  ANIONGAP 11  --  11 9     Hematology Recent Labs  Lab 05/21/19 2204 05/21/19 2254 05/22/19 0302 05/22/19 2327  WBC 15.1*  --  14.3* 10.5  RBC 5.47  --  4.96 4.91  HGB 16.7 15.0 15.2 15.1  HCT 47.3 44.0 43.7 42.8  MCV 86.5  --  88.1 87.2  MCH 30.5  --  30.6 30.8  MCHC 35.3  --  34.8 35.3  RDW 12.0  --  12.4 12.3  PLT 352  --  312 272  BNPNo results for input(s): BNP, PROBNP in the last 168 hours.   DDimer No results for input(s): DDIMER in the last 168 hours.   Radiology    CARDIAC CATHETERIZATION  Result Date: 05/22/2019  Mid LAD lesion is 50% stenosed.  Mid RCA lesion is 25% stenosed.  2nd Mrg lesion is 99% stenosed.  A drug-eluting stent was successfully placed using a STENT RESOLUTE ONYX 2.0X22.  Post intervention, there is a 0% residual stenosis.  The left ventricular systolic function is normal.  LV end diastolic pressure is normal.  The left ventricular ejection fraction is 50-55% by visual estimate.  There is no aortic valve stenosis.  Short aortic arch making JL3.5 difficult to use. EBU 3 Guide catheter was used to engage left main/LAD. EBU 3.5 may be a better choice if PCI was needed.  Continue aggressive secondary prevention along with DAPT.  We spoke about stopping smoking and other lifestyle modifications. I informed his aunt of the findings.   ECHOCARDIOGRAM COMPLETE  Result Date: 05/22/2019   ECHOCARDIOGRAM REPORT   [facility] Date of Exam: 05/22/2019 Medical Rec #:  [id number]     Height:       69.0 in Accession #:    [id number]    Weight:       272.0 lb Date of Birth:  01/09/1971     BSA:          2.35 m Patient Age:    48 years       BP:           132/98 mmHg Patient Gender: M             HR:           66 bpm. Exam Location:  Inpatient Procedure: 2D Echo, Cardiac Doppler and Color Doppler Indications:    Acute coronary syndrome  History:        Patient has no prior history of Echocardiogram examinations.                 Acute MI; Risk Factors:Current Smoker.  Sonographer:    Tonia GhentJulia Underwood RDCS Referring Phys: [id number] Tuck Dulworth D Yides Saidi IMPRESSIONS  1. Left ventricular ejection fraction, by visual estimation, is 50 to 55%. The left ventricle has normal function. There is no left ventricular hypertrophy.  2. Mild hypokinesis of the left ventricular, basal-mid inferolateral wall.  3. Left ventricular diastolic parameters are consistent with Grade I diastolic dysfunction (impaired relaxation).  4. The left ventricle demonstrates regional wall motion abnormalities.  5. Global right ventricle has normal systolic function.The right ventricular size is normal. No increase in right ventricular wall thickness.  6. Left atrial size was normal.  7. Right atrial size was normal.  8. Presence of pericardial fat pad.  9. Trivial pericardial effusion is present. 10. The mitral valve is grossly normal. Trivial mitral valve regurgitation. 11. The tricuspid valve is grossly normal. Tricuspid valve regurgitation is trivial. 12. The aortic valve is tricuspid. Aortic valve regurgitation is not visualized. 13. The pulmonic valve was grossly normal. Pulmonic valve regurgitation is not visualized. 14. TR signal is inadequate for assessing pulmonary artery systolic pressure. 15. The inferior vena cava is normal in size with greater than 50% respiratory variability, suggesting right atrial pressure of 3 mmHg. 16. No prior Echocardiogram. FINDINGS  Left Ventricle: Left ventricular ejection fraction, by visual estimation, is 50 to 55%. The left ventricle has normal function. Mild hypokinesis of the left ventricular, basal-mid inferolateral wall.  The left ventricle  demonstrates regional wall motion abnormalities. The left ventricular internal cavity size was the left ventricle is normal in size. There is no left ventricular hypertrophy. Left ventricular diastolic parameters are consistent with Grade I diastolic dysfunction (impaired relaxation). Normal left atrial pressure. Right Ventricle: The right ventricular size is normal. No increase in right ventricular wall thickness. Global RV systolic function is has normal systolic function. Left Atrium: Left atrial size was normal in size. Right Atrium: Right atrial size was normal in size Pericardium: Trivial pericardial effusion is present. Presence of pericardial fat pad. Mitral Valve: The mitral valve is grossly normal. Trivial mitral valve regurgitation. Tricuspid Valve: The tricuspid valve is grossly normal. Tricuspid valve regurgitation is trivial. Aortic Valve: The aortic valve is tricuspid. Aortic valve regurgitation is not visualized. There is mild calcification of the aortic valve. Pulmonic Valve: The pulmonic valve was grossly normal. Pulmonic valve regurgitation is not visualized. Pulmonic regurgitation is not visualized. Aorta: The aortic root is normal in size and structure. Venous: The inferior vena cava is normal in size with greater than 50% respiratory variability, suggesting right atrial pressure of 3 mmHg. IAS/Shunts: No atrial level shunt detected by color flow Doppler.  LEFT VENTRICLE PLAX 2D LVIDd:         4.86 cm       Diastology LVIDs:         3.54 cm       LV e' lateral:   9.25 cm/s LV PW:         0.86 cm       LV E/e' lateral: 8.3 LV IVS:        0.82 cm       LV e' medial:    6.53 cm/s LVOT diam:     2.10 cm       LV E/e' medial:  11.8 LV SV:         58 ml LV SV Index:   23.30 LVOT Area:     3.46 cm  LV Volumes (MOD) LV area d, A2C:    40.70 cm LV area d, A4C:    42.70 cm LV area s, A2C:    26.70 cm LV area s, A4C:    28.60 cm LV major d, A2C:   8.98 cm LV major d, A4C:   8.98 cm LV major s, A2C:    7.98 cm LV major s, A4C:   7.70 cm LV vol d, MOD A2C: 149.0 ml LV vol d, MOD A4C: 166.0 ml LV vol s, MOD A2C: 78.7 ml LV vol s, MOD A4C: 93.4 ml LV SV MOD A2C:     70.3 ml LV SV MOD A4C:     166.0 ml LV SV MOD BP:      69.6 ml RIGHT VENTRICLE RV S prime:     8.38 cm/s TAPSE (M-mode): 1.9 cm LEFT ATRIUM             Index       RIGHT ATRIUM           Index LA diam:        3.50 cm 1.49 cm/m  RA Area:     16.00 cm LA Vol (A2C):   37.8 ml 16.05 ml/m RA Volume:   46.00 ml  19.54 ml/m LA Vol (A4C):   43.4 ml 18.43 ml/m LA Biplane Vol: 42.9 ml 18.22 ml/m  AORTIC VALVE LVOT Vmax:   78.30 cm/s LVOT Vmean:  56.100 cm/s LVOT VTI:  0.175 m  AORTA Ao Root diam: 3.20 cm MITRAL VALVE MV Area (PHT): 4.80 cm             SHUNTS MV PHT:        45.82 msec           Systemic VTI:  0.18 m MV Decel Time: 158 msec             Systemic Diam: 2.10 cm MV E velocity: 77.10 cm/s 103 cm/s MV A velocity: 54.70 cm/s 70.3 cm/s MV E/A ratio:  1.41       1.5  Lennie Odor MD Electronically signed by Lennie Odor MD Signature Date/Time: 05/22/2019/3:12:56 PM    Final     Cardiac Studies   Cardiac cath 05/21/19:  Mid LAD lesion is 50% stenosed.  Mid RCA lesion is 25% stenosed.  2nd Mrg lesion is 99% stenosed.  A drug-eluting stent was successfully placed using a STENT RESOLUTE ONYX 2.0X22.  Post intervention, there is a 0% residual stenosis.  The left ventricular systolic function is normal.  LV end diastolic pressure is normal.  The left ventricular ejection fraction is 50-55% by visual estimate.  There is no aortic valve stenosis.  Short aortic arch making JL3.5 difficult to use. EBU 3 Guide catheter was used to engage left main/LAD. EBU 3.5 may be a better choice if PCI was needed.  Echo 05/22/19;  1. Left ventricular ejection fraction, by visual estimation, is 50 to 55%. The left ventricle has normal function. There is no left ventricular hypertrophy.  2. Mild hypokinesis of the left ventricular, basal-mid  inferolateral wall.  3. Left ventricular diastolic parameters are consistent with Grade I diastolic dysfunction (impaired relaxation).  4. The left ventricle demonstrates regional wall motion abnormalities.  5. Global right ventricle has normal systolic function.The right ventricular size is normal. No increase in right ventricular wall thickness.  6. Left atrial size was normal.  7. Right atrial size was normal.  8. Presence of pericardial fat pad.  9. Trivial pericardial effusion is present. 10. The mitral valve is grossly normal. Trivial mitral valve regurgitation. 11. The tricuspid valve is grossly normal. Tricuspid valve regurgitation is trivial. 12. The aortic valve is tricuspid. Aortic valve regurgitation is not visualized. 13. The pulmonic valve was grossly normal. Pulmonic valve regurgitation is not visualized. 14. TR signal is inadequate for assessing pulmonary artery systolic pressure. 15. The inferior vena cava is normal in size with greater than 50% respiratory variability, suggesting right atrial pressure of 3 mmHg. 16. No prior Echocardiogram.   Patient Profile     48 y.o. male with history of HTN admitted last night with chest pain. EKG with inferolateral STEMI. Dr. Eldridge Dace performed emergent cardiac cath and he was found to have occlusion of the second OM branch treated with a drug eluting stent.  Assessment & Plan    1. CAD/Inferolateral STEMI: Pt admitted with chest pain on 05/21/19. A drug eluting stent was placed in the severe stenosis in the second obtuse marginal branch. Echo 05/22/19 with LVEF=50-55%, mild hypokinesis of the basal mid inferolateral wall. No chest pain. Continue ASA, Brilinta, statin and beta blocker.  I will increase his Lopressor to 25 mg po BID and start Lisinopril 2.5 mg daily.   Discharge home today. Follow up with Dr. Eldridge Dace or office APP in 1-2 weeks.   For questions or updates, please contact [facility] Please consult [email or web address] for  contact info under        Signed, Cristal Deer  Clifton James, MD  05/23/2019, 8:50 AM

## 2019-05-23 NOTE — Telephone Encounter (Signed)
The pt is being discharged from the hospital today. We will call him tomorrow 

## 2019-05-23 NOTE — Care Management (Signed)
05-23-19 Cost for Brilinta is a concern for the patient. Pharmacists obtained the [place] pay card- patient will see if he can afford once co pay card is run by the pharmacy. If he cannot afford with the co pay card- the office will change to Plavix. No further needs from CM at this time. Bethena Roys , RN,BSN Case Manager [phone]

## 2019-05-23 NOTE — Progress Notes (Signed)
CARDIAC REHAB PHASE I   PRE:  Rate/Rhythm: 90 SR    BP: sitting 131/98    SaO2:   MODE:  Ambulation: 1600 ft   POST:  Rate/Rhythm: 101 ST    BP: sitting 122/90     SaO2:   Pt eager to walk long distance. Tolerated well but BP elevated somewhat. Ed reviewed. Doing well.  [place], ACSM 05/23/2019 8:55 AM

## 2019-05-23 NOTE — TOC Benefit Eligibility Note (Signed)
Transition of Care [facility]) Benefit Eligibility Note    Patient Details  Name: Timothy [name]. MRN: [id number] Date of Birth: 02-Dec-1970   Medication/Dose: Brilinta 90 mg po twice daily  Covered?: Yes  Tier: 2 Drug  Prescription Coverage [facility]erred Pharmacy: [facility], [facility] and [facility]  Spoke with Person/Company/Phone Number:: [place]/ [phone]  Co-Pay: 100.00 for 30 day supply  retail/300. for  90 day supply  Prior Approval: No  Deductible: Met  Additional Notes: Clopidrel 75 mg 10.00 for 30 day retail supply and 30.00 for 90 day supply mail order    Orbie Pyo Phone Number: 05/23/2019, 8:53 AM

## 2019-05-23 NOTE — Discharge Instructions (Signed)
Radial Site Care ° °This sheet gives you information about how to care for yourself after your procedure. Your health care provider may also give you more specific instructions. If you have problems or questions, contact your health care provider. °What can I expect after the procedure? °After the procedure, it is common to have: °· Bruising and tenderness at the catheter insertion area. °Follow these instructions at home: °Medicines °· Take over-the-counter and prescription medicines only as told by your health care provider. °Insertion site care °· Follow instructions from your health care provider about how to take care of your insertion site. Make sure you: °? Wash your hands with soap and water before you change your bandage (dressing). If soap and water are not available, use hand sanitizer. °? Change your dressing as told by your health care provider. °? Leave stitches (sutures), skin glue, or adhesive strips in place. These skin closures may need to stay in place for 2 weeks or longer. If adhesive strip edges start to loosen and curl up, you may trim the loose edges. Do not remove adhesive strips completely unless your health care provider tells you to do that. °· Check your insertion site every day for signs of infection. Check for: °? Redness, swelling, or pain. °? Fluid or blood. °? Pus or a bad smell. °? Warmth. °· Do not take baths, swim, or use a hot tub until your health care provider approves. °· You may shower 24-48 hours after the procedure, or as directed by your health care provider. °? Remove the dressing and gently wash the site with plain soap and water. °? Pat the area dry with a clean towel. °? Do not rub the site. That could cause bleeding. °· Do not apply powder or lotion to the site. °Activity ° °· For 24 hours after the procedure, or as directed by your health care provider: °? Do not flex or bend the affected arm. °? Do not push or pull heavy objects with the affected arm. °? Do not  drive yourself home from the hospital or clinic. You may drive 24 hours after the procedure unless your health care provider tells you not to. °? Do not operate machinery or power tools. °· Do not lift anything that is heavier than 10 lb (4.5 kg), or the limit that you are told, until your health care provider says that it is safe. °· Ask your health care provider when it is okay to: °? Return to work or school. °? Resume usual physical activities or sports. °? Resume sexual activity. °General instructions °· If the catheter site starts to bleed, raise your arm and put firm pressure on the site. If the bleeding does not stop, get help right away. This is a medical emergency. °· If you went home on the same day as your procedure, a responsible adult should be with you for the first 24 hours after you arrive home. °· Keep all follow-up visits as told by your health care provider. This is important. °Contact a health care provider if: °· You have a fever. °· You have redness, swelling, or yellow drainage around your insertion site. °Get help right away if: °· You have unusual pain at the radial site. °· The catheter insertion area swells very fast. °· The insertion area is bleeding, and the bleeding does not stop when you hold steady pressure on the area. °· Your arm or hand becomes pale, cool, tingly, or numb. °These symptoms may represent a serious problem   that is an emergency. Do not wait to see if the symptoms will go away. Get medical help right away. Call your local emergency services (911 in the [place] Do not drive yourself to the hospital. Summary  After the procedure, it is common to have bruising and tenderness at the site.  Follow instructions from your health care provider about how to take care of your radial site wound. Check the wound every day for signs of infection.  Do not lift anything that is heavier than 10 lb (4.5 kg), or the limit that you are told, until your health care provider says  that it is safe. This information is not intended to replace advice given to you by your health care provider. Make sure you discuss any questions you have with your health care provider. Document Released: 07/02/2010 Document Revised: 07/05/2017 Document Reviewed: 07/05/2017 Elsevier Patient Education  2020 [employer]    Information about your medication: Brilinta (anti-platelet agent)  Generic Name (Brand): ticagrelor (Brilinta), twice daily medication  PURPOSE: You are taking this medication along with aspirin to lower your chance of having a heart attack, stroke, or blood clots in your heart stent. These can be fatal. Brilinta and aspirin help prevent platelets from sticking together and forming a clot that can block an artery or your stent.   Common SIDE EFFECTS you may experience include: bruising or bleeding more easily, shortness of breath  Do not stop taking BRILINTA without talking to the doctor who prescribes it for you. People who are treated with a stent and stop taking Brilinta too soon, have a higher risk of getting a blood clot in the stent, having a heart attack, or dying. If you stop Brilinta because of bleeding, or for other reasons, your risk of a heart attack or stroke may increase.   Tell all of your doctors and dentists that you are taking Brilinta. They should talk to the doctor who prescribed Brilinta for you before you have any surgery or invasive procedure.   Contact your health care provider if you experience: severe or uncontrollable bleeding, pink/red/brown urine, vomiting blood or vomit that looks like "coffee grounds", red or black stools (looks like tar), coughing up blood or blood clots ----------------------------------------------------------------------------------------------------------------------   Heart-Healthy Eating Plan Heart-healthy meal planning includes:  Eating less unhealthy fats.  Eating more healthy fats.  Making other changes in  your diet. Talk with your doctor or a diet specialist (dietitian) to create an eating plan that is right for you. What is my plan? Your doctor may recommend an eating plan that includes:  Total fat: ______% or less of total calories a day.  Saturated fat: ______% or less of total calories a day.  Cholesterol: less than _________mg a day. What are tips for following this plan? Cooking Avoid frying your food. Try to bake, boil, grill, or broil it instead. You can also reduce fat by:  Removing the skin from poultry.  Removing all visible fats from meats.  Steaming vegetables in water or broth. Meal planning   At meals, divide your plate into four equal parts: ? Fill one-half of your plate with vegetables and green salads. ? Fill one-fourth of your plate with whole grains. ? Fill one-fourth of your plate with lean protein foods.  Eat 4-5 servings of vegetables per day. A serving of vegetables is: ? 1 cup of raw or cooked vegetables. ? 2 cups of raw leafy greens.  Eat 4-5 servings of fruit per day. A serving of  fruit is: ? 1 medium whole fruit. ?  cup of dried fruit. ?  cup of fresh, frozen, or canned fruit. ?  cup of 100% fruit juice.  Eat more foods that have soluble fiber. These are apples, broccoli, carrots, beans, peas, and barley. Try to get 20-30 g of fiber per day.  Eat 4-5 servings of nuts, legumes, and seeds per week: ? 1 serving of dried beans or legumes equals  cup after being cooked. ? 1 serving of nuts is  cup. ? 1 serving of seeds equals 1 tablespoon. General information  Eat more home-cooked food. Eat less restaurant, buffet, and fast food.  Limit or avoid alcohol.  Limit foods that are high in starch and sugar.  Avoid fried foods.  Lose weight if you are overweight.  Keep track of how much salt (sodium) you eat. This is important if you have high blood pressure. Ask your doctor to tell you more about this.  Try to add vegetarian meals each  week. Fats  Choose healthy fats. These include olive oil and canola oil, flaxseeds, walnuts, almonds, and seeds.  Eat more omega-3 fats. These include salmon, mackerel, sardines, tuna, flaxseed oil, and ground flaxseeds. Try to eat fish at least 2 times each week.  Check food labels. Avoid foods with trans fats or high amounts of saturated fat.  Limit saturated fats. ? These are often found in animal products, such as meats, butter, and cream. ? These are also found in plant foods, such as palm oil, palm kernel oil, and coconut oil.  Avoid foods with partially hydrogenated oils in them. These have trans fats. Examples are stick margarine, some tub margarines, cookies, crackers, and other baked goods. What foods can I eat? Fruits All fresh, canned (in natural juice), or frozen fruits. Vegetables Fresh or frozen vegetables (raw, steamed, roasted, or grilled). Green salads. Grains Most grains. Choose whole wheat and whole grains most of the time. Rice and pasta, including brown rice and pastas made with whole wheat. Meats and other proteins Lean, well-trimmed beef, veal, pork, and lamb. Chicken and [place] without skin. All fish and shellfish. Wild duck, rabbit, pheasant, and venison. Egg whites or low-cholesterol egg substitutes. Dried beans, peas, lentils, and tofu. Seeds and most nuts. Dairy Low-fat or nonfat cheeses, including ricotta and mozzarella. Skim or 1% milk that is liquid, powdered, or evaporated. Buttermilk that is made with low-fat milk. Nonfat or low-fat yogurt. Fats and oils Non-hydrogenated (trans-free) margarines. Vegetable oils, including soybean, sesame, sunflower, olive, peanut, safflower, corn, canola, and cottonseed. Salad dressings or mayonnaise made with a vegetable oil. Beverages Mineral water. Coffee and tea. Diet carbonated beverages. Sweets and desserts Sherbet, gelatin, and fruit ice. Small amounts of dark chocolate. Limit all sweets and desserts. Seasonings  and condiments All seasonings and condiments. The items listed above may not be a complete list of foods and drinks you can eat. Contact a dietitian for more options. What foods should I avoid? Fruits Canned fruit in heavy syrup. Fruit in cream or butter sauce. Fried fruit. Limit coconut. Vegetables Vegetables cooked in cheese, cream, or butter sauce. Fried vegetables. Grains Breads that are made with saturated or trans fats, oils, or whole milk. Croissants. Sweet rolls. Donuts. High-fat crackers, such as cheese crackers. Meats and other proteins Fatty meats, such as hot dogs, ribs, sausage, bacon, rib-eye roast or steak. High-fat deli meats, such as salami and bologna. Caviar. Domestic duck and goose. Organ meats, such as liver. Dairy Cream, sour cream, cream cheese,  and creamed cottage cheese. Whole-milk cheeses. Whole or 2% milk that is liquid, evaporated, or condensed. Whole buttermilk. Cream sauce or high-fat cheese sauce. Yogurt that is made from whole milk. Fats and oils Meat fat, or shortening. Cocoa butter, hydrogenated oils, palm oil, coconut oil, palm kernel oil. Solid fats and shortenings, including bacon fat, salt pork, lard, and butter. Nondairy cream substitutes. Salad dressings with cheese or sour cream. Beverages Regular sodas and juice drinks with added sugar. Sweets and desserts Frosting. Pudding. Cookies. Cakes. Pies. Milk chocolate or white chocolate. Buttered syrups. Full-fat ice cream or ice cream drinks. The items listed above may not be a complete list of foods and drinks to avoid. Contact a dietitian for more information. Summary  Heart-healthy meal planning includes eating less unhealthy fats, eating more healthy fats, and making other changes in your diet.  Eat a balanced diet. This includes fruits and vegetables, low-fat or nonfat dairy, lean protein, nuts and legumes, whole grains, and heart-healthy oils and fats. This information is not intended to replace  advice given to you by your health care provider. Make sure you discuss any questions you have with your health care provider. Document Released: 11/29/2011 Document Revised: 08/03/2017 Document Reviewed: 07/07/2017 Elsevier Patient Education  2020 [employer].

## 2019-05-23 NOTE — Telephone Encounter (Signed)
° ° °  TOC appt scheduled for 12/23 [facility]

## 2019-05-23 NOTE — Discharge Summary (Signed)
Discharge Summary    Patient ID: Timothy Strohecker.,  MRN: [id number], DOB/AGE: 1970-07-17 48 y.o.  Admit date: 05/21/2019 Discharge date: 05/23/2019  Primary Care Provider: Janie Morning Primary Cardiologist: Larae Grooms, [name]  Discharge Diagnoses    Principal Problem:   Acute MI, inferolateral wall [facility] Active Problems:   Acute MI, lateral wall (HCC)   Hyperlipidemia   Hypertension   Allergies Allergies  Allergen Reactions  . Morphine And Related Hives    Diagnostic Studies/Procedures    Cath: 05/21/19   Mid LAD lesion is 50% stenosed.  Mid RCA lesion is 25% stenosed.  2nd Mrg lesion is 99% stenosed.  A drug-eluting stent was successfully placed using a STENT RESOLUTE ONYX 2.0X22.  Post intervention, there is a 0% residual stenosis.  The left ventricular systolic function is normal.  LV end diastolic pressure is normal.  The left ventricular ejection fraction is 50-55% by visual estimate.  There is no aortic valve stenosis.  Short aortic arch making JL3.5 difficult to use. EBU 3 Guide catheter was used to engage left main/LAD. EBU 3.5 may be a better choice if PCI was needed.   Continue aggressive secondary prevention along with DAPT.    We spoke about stopping smoking and other lifestyle modifications.   I informed his aunt of the findings.   Diagnostic Dominance: Right  Intervention    _____________   History of Present Illness     Timothy Uzair Godley. is a 48 y.o. male with history of HTN admitted with chest pain. He complained of acute onset mid chest pressure/gas like indigestion feeling 'in the middle of the day', at rest, lasted an hour or so and went away. Later that evening, at home, after showering, felt similar pain/discomfort, mid chest, radiating to left arm, with sob. Symptoms were  acute onset, moderate, persistent, constant, dull/pressure. Stated symptoms were similar milder/briefer pain in past. No prior cardiac cath. +fam hx cad.  No pleuritic pain. No leg pain or swelling. No chest wall injury or strain. No cough or uri symptoms. No fever or chills. No known covid + exposure.  EKG noted with inferolateral STEMI.   Hospital Course     Underwent cardiac cath noted above with DES x1 placed to 2nd OM. Residual disease with 50% in the mLAD planned for medical management. Placed on DAPT with ASA/Brilinta for at least one year. Follow up echo showed LVEF=50-55%, mild hypokinesis of the basal mid inferolateral wall. He was able to tolerate the addition of metoprolol 25mg  BID and lisinopril 2.5mg  daily. Worked well with cardiac rehab without recurrent chest pain. CM check on Brilinta noted an initial Copay around $100. Patient instructed to let us know if cost remains elevated. May need to switch to plavix after 30 days. Educated by PharmD prior to discharge.    Timothy Dynegy. was seen by Dr. Angelena Form and determined stable for discharge home. Follow up in the office has been arranged. Medications are listed below.  _____________  Discharge Vitals Blood pressure 126/82, pulse 75, temperature 98.2 F (36.8 C), temperature source Oral, resp. rate 18, height 5\' 9"  (1.753 m), weight 123.4 kg, SpO2 96 %.  Filed Weights   05/21/19 2230  Weight: 123.4 kg    Labs & Radiologic Studies    CBC Recent Labs    05/21/19 2204 05/22/19 0302 05/22/19 2327  WBC 15.1* 14.3* 10.5  NEUTROABS 10.2*  --   --   HGB 16.7 15.2 15.1  HCT 47.3 43.7 42.8  MCV 86.5 88.1 87.2  PLT 352 312 272   Basic Metabolic Panel Recent Labs    09/81/19 0302 05/23/19 0447  NA 137 139  K 3.9 4.3  CL 102 103  CO2 24 27  GLUCOSE 152* 127*  BUN 6 7  CREATININE 1.07 1.22  CALCIUM 8.7* 8.8*   Liver Function Tests Recent Labs    05/21/19 2204  AST 25  ALT 24  ALKPHOS 80  BILITOT 0.4  PROT 7.1  ALBUMIN 3.9   No results for input(s): LIPASE, AMYLASE in the last 72 hours. Cardiac Enzymes No results for input(s): CKTOTAL, CKMB, CKMBINDEX, TROPONINI  in the last 72 hours. BNP Invalid input(s): POCBNP D-Dimer No results for input(s): DDIMER in the last 72 hours. Hemoglobin A1C No results for input(s): HGBA1C in the last 72 hours. Fasting Lipid Panel Recent Labs    05/21/19 2205  CHOL 228*  HDL 36*  LDLCALC 130*  TRIG 311*  CHOLHDL 6.3   Thyroid Function Tests No results for input(s): TSH, T4TOTAL, T3FREE, THYROIDAB in the last 72 hours.  Invalid input(s): FREET3 _____________  CARDIAC CATHETERIZATION  Result Date: 05/22/2019  Mid LAD lesion is 50% stenosed.  Mid RCA lesion is 25% stenosed.  2nd Mrg lesion is 99% stenosed.  A drug-eluting stent was successfully placed using a STENT RESOLUTE ONYX 2.0X22.  Post intervention, there is a 0% residual stenosis.  The left ventricular systolic function is normal.  LV end diastolic pressure is normal.  The left ventricular ejection fraction is 50-55% by visual estimate.  There is no aortic valve stenosis.  Short aortic arch making JL3.5 difficult to use. EBU 3 Guide catheter was used to engage left main/LAD. EBU 3.5 may be a better choice if PCI was needed.  Continue aggressive secondary prevention along with DAPT.  We spoke about stopping smoking and other lifestyle modifications. I informed his aunt of the findings.   ECHOCARDIOGRAM COMPLETE  Result Date: 05/22/2019   ECHOCARDIOGRAM REPORT   Patient [facility]:   [facility] Date of Exam: 05/22/2019 Medical Rec #:  [id number]     Height:       69.0 in Accession #:    [id number]    Weight:       272.0 lb Date of Birth:  1971-06-07     BSA:          2.35 m Patient Age:    48 years      BP:           132/98 mmHg Patient Gender: M             HR:           66 bpm. Exam Location:  Inpatient Procedure: 2D Echo, Cardiac Doppler and Color Doppler Indications:    Acute coronary syndrome  History:        Patient has no prior history of Echocardiogram examinations.                 Acute MI; Risk Factors:Current Smoker.  Sonographer:    Tonia Ghent RDCS Referring Phys: [id number] CHRISTOPHER D MCALHANY IMPRESSIONS  1. Left ventricular ejection fraction, by visual estimation, is 50 to 55%. The left ventricle has normal function. There is no left ventricular hypertrophy.  2. Mild hypokinesis of the left ventricular, basal-mid inferolateral wall.  3. Left ventricular diastolic parameters are consistent with Grade I diastolic dysfunction (impaired relaxation).  4. The left ventricle demonstrates regional wall motion abnormalities.  5. Global right  ventricle has normal systolic function.The right ventricular size is normal. No increase in right ventricular wall thickness.  6. Left atrial size was normal.  7. Right atrial size was normal.  8. Presence of pericardial fat pad.  9. Trivial pericardial effusion is present. 10. The mitral valve is grossly normal. Trivial mitral valve regurgitation. 11. The tricuspid valve is grossly normal. Tricuspid valve regurgitation is trivial. 12. The aortic valve is tricuspid. Aortic valve regurgitation is not visualized. 13. The pulmonic valve was grossly normal. Pulmonic valve regurgitation is not visualized. 14. TR signal is inadequate for assessing pulmonary artery systolic pressure. 15. The inferior vena cava is normal in size with greater than 50% respiratory variability, suggesting right atrial pressure of 3 mmHg. 16. No prior Echocardiogram. FINDINGS  Left Ventricle: Left ventricular ejection fraction, by visual estimation, is 50 to 55%. The left ventricle has normal function. Mild hypokinesis of the left ventricular, basal-mid inferolateral wall. The left ventricle demonstrates regional wall motion abnormalities. The left ventricular internal cavity size was the left ventricle is normal in size. There is no left ventricular hypertrophy. Left ventricular diastolic parameters are consistent with Grade I diastolic dysfunction (impaired relaxation). Normal left atrial pressure. Right Ventricle: The right ventricular size  is normal. No increase in right ventricular wall thickness. Global RV systolic function is has normal systolic function. Left Atrium: Left atrial size was normal in size. Right Atrium: Right atrial size was normal in size Pericardium: Trivial pericardial effusion is present. Presence of pericardial fat pad. Mitral Valve: The mitral valve is grossly normal. Trivial mitral valve regurgitation. Tricuspid Valve: The tricuspid valve is grossly normal. Tricuspid valve regurgitation is trivial. Aortic Valve: The aortic valve is tricuspid. Aortic valve regurgitation is not visualized. There is mild calcification of the aortic valve. Pulmonic Valve: The pulmonic valve was grossly normal. Pulmonic valve regurgitation is not visualized. Pulmonic regurgitation is not visualized. Aorta: The aortic root is normal in size and structure. Venous: The inferior vena cava is normal in size with greater than 50% respiratory variability, suggesting right atrial pressure of 3 mmHg. IAS/Shunts: No atrial level shunt detected by color flow Doppler.  LEFT VENTRICLE PLAX 2D LVIDd:         4.86 cm       Diastology LVIDs:         3.54 cm       LV e' lateral:   9.25 cm/s LV PW:         0.86 cm       LV E/e' lateral: 8.3 LV IVS:        0.82 cm       LV e' medial:    6.53 cm/s LVOT diam:     2.10 cm       LV E/e' medial:  11.8 LV SV:         58 ml LV SV Index:   23.30 LVOT Area:     3.46 cm  LV Volumes (MOD) LV area d, A2C:    40.70 cm LV area d, A4C:    42.70 cm LV area s, A2C:    26.70 cm LV area s, A4C:    28.60 cm LV major d, A2C:   8.98 cm LV major d, A4C:   8.98 cm LV major s, A2C:   7.98 cm LV major s, A4C:   7.70 cm LV vol d, MOD A2C: 149.0 ml LV vol d, MOD A4C: 166.0 ml LV vol s, MOD A2C: 78.7 ml LV vol  s, MOD A4C: 93.4 ml LV SV MOD A2C:     70.3 ml LV SV MOD A4C:     166.0 ml LV SV MOD BP:      69.6 ml RIGHT VENTRICLE RV S prime:     8.38 cm/s TAPSE (M-mode): 1.9 cm LEFT ATRIUM             Index       RIGHT ATRIUM           Index LA  diam:        3.50 cm 1.49 cm/m  RA Area:     16.00 cm LA Vol (A2C):   37.8 ml 16.05 ml/m RA Volume:   46.00 ml  19.54 ml/m LA Vol (A4C):   43.4 ml 18.43 ml/m LA Biplane Vol: 42.9 ml 18.22 ml/m  AORTIC VALVE LVOT Vmax:   78.30 cm/s LVOT Vmean:  56.100 cm/s LVOT VTI:    0.175 m  AORTA Ao Root diam: 3.20 cm MITRAL VALVE MV Area (PHT): 4.80 cm             SHUNTS MV PHT:        45.82 msec           Systemic VTI:  0.18 m MV Decel Time: 158 msec             Systemic Diam: 2.10 cm MV E velocity: 77.10 cm/s 103 cm/s MV A velocity: 54.70 cm/s 70.3 cm/s MV E/A ratio:  1.41       1.5  Timothy [name] Electronically signed by Timothy [name] Signature Date/Time: 05/22/2019/3:12:56 PM    Final    Disposition   Pt is being discharged home today in good condition.  Follow-up Plans & Appointments    Follow-up Information    Leone Brandngold, Laura R, NP Follow up on 06/05/2019.   Specialties: Cardiology, Radiology Why: at 11am for your follow up appt.  Contact information: [address] [place] [address] [phone]          Discharge Instructions    Amb Referral to Cardiac Rehabilitation   Complete by: As directed    Diagnosis:  Coronary Stents STEMI PTCA     After initial evaluation and assessments completed: Virtual Based Care may be provided alone or in conjunction with Phase 2 Cardiac Rehab based on patient barriers.: Yes   Call [name] for:  redness, tenderness, or signs of infection (pain, swelling, redness, odor or green/yellow discharge around incision site)   Complete by: As directed    Diet - low sodium heart healthy   Complete by: As directed    Discharge instructions   Complete by: As directed    Radial Site Care Refer to this sheet in the next few weeks. These instructions provide you with information on caring for yourself after your procedure. Your caregiver may also give you more specific instructions. Your treatment has been planned according to current medical  practices, but problems sometimes occur. Call your caregiver if you have any problems or questions after your procedure. HOME CARE INSTRUCTIONS You may shower the day after the procedure.Remove the bandage (dressing) and gently wash the site with plain soap and water.Gently pat the site dry.  Do not apply powder or lotion to the site.  Do not submerge the affected site in water for 3 to 5 days.  Inspect the site at least twice daily.  Do not flex or bend the affected arm for 24 hours.  No  lifting over 5 pounds (2.3 kg) for 5 days after your procedure.  Do not drive home if you are discharged the same day of the procedure. Have someone else drive you.  You may drive 24 hours after the procedure unless otherwise instructed by your caregiver.  What to expect: Any bruising will usually fade within 1 to 2 weeks.  Blood that collects in the tissue (hematoma) may be painful to the touch. It should usually decrease in size and tenderness within 1 to 2 weeks.  SEEK IMMEDIATE MEDICAL CARE IF: You have unusual pain at the radial site.  You have redness, warmth, swelling, or pain at the radial site.  You have drainage (other than a small amount of blood on the dressing).  You have chills.  You have a fever or persistent symptoms for more than 72 hours.  You have a fever and your symptoms suddenly get worse.  Your arm becomes pale, cool, tingly, or numb.  You have heavy bleeding from the site. Hold pressure on the site.   PLEASE DO NOT MISS ANY DOSES OF YOUR BRILINTA!!!!! Also keep a log of you blood pressures and bring back to your follow up appt. Please call the office with any questions.   Patients taking blood thinners should generally stay away from medicines like ibuprofen, Advil, Motrin, naproxen, and Aleve due to risk of stomach bleeding. You may take Tylenol as directed or talk to your primary doctor about alternatives.   Increase activity slowly   Complete by: As directed        Discharge Medications     Medication List    STOP taking these medications   diclofenac 75 MG EC tablet Commonly known as: VOLTAREN   meloxicam 15 MG tablet Commonly known as: MOBIC   methocarbamol 500 MG tablet Commonly known as: ROBAXIN   nabumetone 750 MG tablet Commonly known as: RELAFEN     TAKE these medications   allopurinol 300 MG tablet Commonly known as: ZYLOPRIM Take 300 mg by mouth daily.   aspirin 81 MG chewable tablet Chew 1 tablet (81 mg total) by mouth daily. Start taking on: May 24, 2019   atorvastatin 80 MG tablet Commonly known as: LIPITOR Take 1 tablet (80 mg total) by mouth daily at 6 PM.   lisinopril 2.5 MG tablet Commonly known as: ZESTRIL Take 1 tablet (2.5 mg total) by mouth daily.   metaxalone 800 MG tablet Commonly known as: SKELAXIN Take 800 mg by mouth as needed for muscle spasms.   metoprolol tartrate 25 MG tablet Commonly known as: LOPRESSOR Take 1 tablet (25 mg total) by mouth 2 (two) times daily.   Mitigare 0.6 MG Caps Generic drug: Colchicine Take 0.6 mg by mouth as needed (gout).   nitroGLYCERIN 0.4 MG SL tablet Commonly known as: Nitrostat Place 1 tablet (0.4 mg total) under the tongue every 5 (five) minutes as needed.   ticagrelor 90 MG Tabs tablet Commonly known as: BRILINTA Take 1 tablet (90 mg total) by mouth 2 (two) times daily.        Yes                               AHA/ACC Clinical Performance & Quality Measures: 1. Aspirin prescribed? - Yes 2. ADP Receptor Inhibitor (Plavix/Clopidogrel, Brilinta/Ticagrelor or Effient/Prasugrel) prescribed (includes medically managed patients)? - Yes 3. Beta Blocker prescribed? - Yes 4. High Intensity Statin (Lipitor 40-80mg  or Crestor 20-40mg ) prescribed? -  Yes 5. EF assessed during THIS hospitalization? - Yes 6. For EF <40%, was ACEI/ARB prescribed? - Yes 7. For EF <40%, Aldosterone Antagonist (Spironolactone or Eplerenone) prescribed? - Not Applicable (EF >/=  40%) 8. Cardiac Rehab Phase II ordered (Included Medically managed Patients)? - Yes      Outstanding Labs/Studies   FLP/LFTs in 6 weeks. BMET at follow up appt.   Duration of Discharge Encounter   Greater than 30 minutes including physician time.  Signed, Laverda Page NP-C 05/23/2019, 10:46 AM

## 2019-05-24 ENCOUNTER — Encounter (HOSPITAL_COMMUNITY): Payer: Self-pay

## 2019-05-24 NOTE — Telephone Encounter (Signed)
**Note De-Identified Phylisha Dix Obfuscation** TCM Call 1st attempt: I have called [phone] multiple times and at different times this morning and get no answer.The message at this number states "this is not a working number". There are no other phone numbers listed in chart to reach the pt at.  We will cont to try to reach the pt.

## 2019-05-27 DIAGNOSIS — H7291 Unspecified perforation of tympanic membrane, right ear: Secondary | ICD-10-CM | POA: Diagnosis not present

## 2019-05-29 NOTE — Telephone Encounter (Signed)
Not a working # (05/29/19)

## 2019-06-03 NOTE — Telephone Encounter (Signed)
**Note De-Identified [name] Obfuscation** TCM Call 3rd attempt Cannot contact the pt Timothy [name] phone number listed in chart as it is not a working number.

## 2019-06-04 NOTE — Progress Notes (Signed)
Cardiology Office Note   Date:  06/05/2019   ID:  Timothy Lenz., DOB 1971/03/07, MRN [id number]  PCP:  Irena Reichmann, DO  Cardiologist:  Dr. Eldridge Dace     Chief Complaint  Patient presents with  . Hospitalization Follow-up      History of Present Illness: Timothy Adinolfi. is a 48 y.o. male who presents for post hospitalization.   history of HTN admitted with chest pain. He complained of acute onset mid chest pressure/gas like indigestion feeling 'in the middle of the day', at rest, lasted an hour or so and went away. Later that evening, at home, after showering, felt similar pain/discomfort, mid chest, radiating to left arm, with sob. Symptoms were  acute onset, moderate, persistent, constant, dull/pressure. Stated symptoms were similar milder/briefer pain in past. No prior cardiac cath. +fam hx cad. No pleuritic pain. No leg pain or swelling. No chest wall injury or strain. No cough or uri symptoms. No fever or chills. No known covid + exposure. EKG noted with inferolateral STEMI.   DES x1 placed to 2nd OM. Residual disease with 50% in the mLAD planned for medical management. Placed on DAPT with ASA/Brilinta for at least one year. Follow up echo showed LVEF=50-55%, mild hypokinesis of the basal mid inferolateral wall. He was able to tolerate the addition of metoprolol 25mg  BID and lisinopril 2.5mg  daily. Worked well with cardiac rehab without recurrent chest pain. CM check on Brilinta noted an initial Copay around $100. Patient instructed to let know if cost remains elevated. May need to switch to plavix after 30 days. Educated by PharmD prior to discharge. pk troponin hs 4,923  Today he has no chest pain, he is dyspneic with exertion he stops and rests and it resolves.  Does not awaken from sleep with SOB.  No swelling.  No CXR in Epic.  Pt did smoke but none since hospital -Congratulated.   He is walking 1/4 mile to 1/2 mile daily.  But again stops if SOB.  Eating healthier.  His  glucose was elevated at times, but no HgbA1c drawn.  Will check on next visit.  LDL was 130 and pt placed on lipitor 80, will recheck in 6 weeks.     Past Medical History:  Diagnosis Date  . Coronary artery disease   . Hyperlipidemia   . Hypertension   . S/P angioplasty with stent to OM2  DES 05/21/2019  . STEMI (ST elevation myocardial infarction) (HCC) 05/21/2019  . Tobacco use     Past Surgical History:  Procedure Laterality Date  . CORONARY/GRAFT ACUTE MI REVASCULARIZATION N/A 05/21/2019   Procedure: Coronary/Graft Acute MI Revascularization;  Surgeon: 14/01/2019, MD;  Location: [facility] INVASIVE CV LAB;  Service: Cardiovascular;  Laterality: N/A;  . KNEE ARTHROSCOPY    . LEFT HEART CATH AND CORONARY ANGIOGRAPHY N/A 05/21/2019   Procedure: LEFT HEART CATH AND CORONARY ANGIOGRAPHY;  Surgeon: 14/01/2019, MD;  Location: [facility] INVASIVE CV LAB;  Service: Cardiovascular;  Laterality: N/A;  . SHOULDER SURGERY       Current Outpatient Medications  Medication Sig Dispense Refill  . allopurinol (ZYLOPRIM) 300 MG tablet Take 300 mg by mouth daily.     [facility] aspirin 81 MG chewable tablet Chew 1 tablet (81 mg total) by mouth daily. 90 tablet 1  . atorvastatin (LIPITOR) 80 MG tablet Take 1 tablet (80 mg total) by mouth daily at 6 PM. 90 tablet 1  . lisinopril (ZESTRIL) 2.5 MG tablet Take 1 tablet (  2.5 mg total) by mouth daily. 90 tablet 1  . metoprolol tartrate (LOPRESSOR) 25 MG tablet Take 1 tablet (25 mg total) by mouth 2 (two) times daily. 180 tablet 1  . MITIGARE 0.6 MG CAPS Take 0.6 mg by mouth as needed (gout).     . nitroGLYCERIN (NITROSTAT) 0.4 MG SL tablet Place 1 tablet (0.4 mg total) under the tongue every 5 (five) minutes as needed. 25 tablet 2  . ticagrelor (BRILINTA) 90 MG TABS tablet Take 1 tablet (90 mg total) by mouth 2 (two) times daily. 180 tablet 1   No current facility-administered medications for this visit.    Allergies:   Morphine and related    Social  History:  The patient  reports that he has been smoking cigarettes. He has been smoking about 1.00 pack per day. His smokeless tobacco use includes snuff. He reports that he does not drink alcohol or use drugs.   Family History:  The patient's family history includes Diabetes in his mother.    ROS:  General:no colds or fevers, no weight changes Skin:no rashes or ulcers HEENT:no blurred vision, no congestion CV:see HPI PUL:see HPI GI:no diarrhea constipation or melena, no indigestion GU:no hematuria, no dysuria MS:no joint pain, no claudication Neuro:no syncope, no lightheadedness Endo:no diabetes, no thyroid disease  Wt Readings from Last 3 Encounters:  06/05/19 274 lb (124.3 kg)  05/21/19 272 lb (123.4 kg)  03/24/18 260 lb (117.9 kg)     PHYSICAL EXAM: VS:  BP 130/82   Pulse 75   Ht 5\' 9"  (1.753 m)   Wt 274 lb (124.3 kg)   SpO2 95%   BMI 40.46 kg/m  , BMI Body mass index is 40.46 kg/m. General:Pleasant affect, NAD Skin:Warm and dry, brisk capillary refill HEENT:normocephalic, sclera clear, mucus membranes moist Neck:supple, no JVD, no bruits  Heart:S1S2 RRR without murmur, gallup, rub or click, cath site withotu hematoma of Rt wrist. Lungs:clear without rales, rhonchi, or wheezes PJK:DTOI, non tender, + BS, do not palpate liver spleen or masses Ext:no lower ext edema, 2+ pedal pulses, 2+ radial pulses Neuro:alert and oriented, MAE, follows commands, + facial symmetry    EKG:  EKG is ordered today. The ekg ordered today demonstrates SRat 64 with deep T wave inversions III and AVF with acute MI hx.    Recent Labs: 05/21/2019: ALT 24 05/22/2019: Hemoglobin 15.1; Platelets 272 05/23/2019: BUN 7; Creatinine, Ser 1.22; Potassium 4.3; Sodium 139    Lipid Panel    Component Value Date/Time   CHOL 228 (H) 05/21/2019 2205   TRIG 311 (H) 05/21/2019 2205   HDL 36 (L) 05/21/2019 2205   CHOLHDL 6.3 05/21/2019 2205   VLDL 62 (H) 05/21/2019 2205   LDLCALC 130 (H)  05/21/2019 2205       Other studies Reviewed: Additional studies/ records that were reviewed today include: .  Cath: 05/21/19   Mid LAD lesion is 50% stenosed.  Mid RCA lesion is 25% stenosed.  2nd Mrg lesion is 99% stenosed.  A drug-eluting stent was successfully placed using a STENT RESOLUTE ONYX 2.0X22.  Post intervention, there is a 0% residual stenosis.  The left ventricular systolic function is normal.  LV end diastolic pressure is normal.  The left ventricular ejection fraction is 50-55% by visual estimate.  There is no aortic valve stenosis.  Short aortic arch making JL3.5 difficult to use. EBU 3 Guide catheter was used to engage left main/LAD. EBU 3.5 may be a better choice if PCI was needed.  Continue aggressive secondary prevention along with DAPT.   We spoke about stopping smoking and other lifestyle modifications.   I informed his aunt of the findings.   Diagnostic Dominance: Right  Intervention     Echo 05/22/19 IMPRESSIONS    1. Left ventricular ejection fraction, by visual estimation, is 50 to 55%. The left ventricle has normal function. There is no left ventricular hypertrophy.  2. Mild hypokinesis of the left ventricular, basal-mid inferolateral wall.  3. Left ventricular diastolic parameters are consistent with Grade I diastolic dysfunction (impaired relaxation).  4. The left ventricle demonstrates regional wall motion abnormalities.  5. Global right ventricle has normal systolic function.The right ventricular size is normal. No increase in right ventricular wall thickness.  6. Left atrial size was normal.  7. Right atrial size was normal.  8. Presence of pericardial fat pad.  9. Trivial pericardial effusion is present. 10. The mitral valve is grossly normal. Trivial mitral valve regurgitation. 11. The tricuspid valve is grossly normal. Tricuspid valve regurgitation is trivial. 12. The aortic valve is tricuspid. Aortic valve  regurgitation is not visualized. 13. The pulmonic valve was grossly normal. Pulmonic valve regurgitation is not visualized. 14. TR signal is inadequate for assessing pulmonary artery systolic pressure. 15. The inferior vena cava is normal in size with greater than 50% respiratory variability, suggesting right atrial pressure of 3 mmHg. 16. No prior Echocardiogram.  FINDINGS  Left Ventricle: Left ventricular ejection fraction, by visual estimation, is 50 to 55%. The left ventricle has normal function. Mild hypokinesis of the left ventricular, basal-mid inferolateral wall. The left ventricle demonstrates regional wall motion  abnormalities. The left ventricular internal cavity size was the left ventricle is normal in size. There is no left ventricular hypertrophy. Left ventricular diastolic parameters are consistent with Grade I diastolic dysfunction (impaired relaxation).  Normal left atrial pressure.  Right Ventricle: The right ventricular size is normal. No increase in right ventricular wall thickness. Global RV systolic function is has normal systolic function.  Left Atrium: Left atrial size was normal in size.  Right Atrium: Right atrial size was normal in size  Pericardium: Trivial pericardial effusion is present. Presence of pericardial fat pad.  Mitral Valve: The mitral valve is grossly normal. Trivial mitral valve regurgitation.  Tricuspid Valve: The tricuspid valve is grossly normal. Tricuspid valve regurgitation is trivial.  Aortic Valve: The aortic valve is tricuspid. Aortic valve regurgitation is not visualized. There is mild calcification of the aortic valve.  Pulmonic Valve: The pulmonic valve was grossly normal. Pulmonic valve regurgitation is not visualized. Pulmonic regurgitation is not visualized.  Aorta: The aortic root is normal in size and structure.  Venous: The inferior vena cava is normal in size with greater than 50% respiratory variability, suggesting  right atrial pressure of 3 mmHg.  IAS/Shunts: No atrial level shunt detected by color flow Doppler.     ASSESSMENT AND PLAN:  1.  Inferolateral STEMI 05/21/19 with emergent cath and DES to second OM branch of LCX.  Pt has done well. On asa and brilinta plan for cardiac rehab  2.  SOB/DOE lungs clear, but will check pro BNP and 2 V CXR.  We discussed this may be from Brilinta and if possible would like to complete the first month though if SOB increases will change to plavix earlier.  Plan to see back in 2 weeks to convert to plavix 300 mg to load then 75 mg daily.  discussed with Dr. Katrinka BlazingSmith --but with DOE I do not believe  he can return to work just yet.  Will keep out of work until DOE is improved.  Most likely when on plavix.  Though will see pro BNP and CXR.   3.  HLD on statin, will recheck in 6 weeks with hepatic panel to insure good levels, <70 LDL  4.  Tobacco use, has stopped congratulated.  5.  HTN controlled goal 134 or less systolic.  6.  CAD with residual disease mild in RCA and moderate in LAD treating medically   Current medicines are reviewed with the patient today.  The patient Has no concerns regarding medicines.  The following changes have been made:  See above Labs/ tests ordered today include:see above  Disposition:   FU:  see above  Signed, Nada Boozer, NP  06/05/2019 12:29 PM    [facility] [address] [place], [place], [place]  [address]/ [address] [place], [place] Phone: [phone]; Fax: [phone]  [phone]

## 2019-06-05 ENCOUNTER — Ambulatory Visit (INDEPENDENT_AMBULATORY_CARE_PROVIDER_SITE_OTHER): Payer: BC Managed Care – PPO | Admitting: Cardiology

## 2019-06-05 ENCOUNTER — Other Ambulatory Visit: Payer: Self-pay

## 2019-06-05 ENCOUNTER — Encounter: Payer: Self-pay | Admitting: Cardiology

## 2019-06-05 VITALS — BP 130/82 | HR 75 | Ht 69.0 in | Wt 274.0 lb

## 2019-06-05 DIAGNOSIS — Z79899 Other long term (current) drug therapy: Secondary | ICD-10-CM

## 2019-06-05 DIAGNOSIS — E782 Mixed hyperlipidemia: Secondary | ICD-10-CM

## 2019-06-05 DIAGNOSIS — I2129 ST elevation (STEMI) myocardial infarction involving other sites: Secondary | ICD-10-CM

## 2019-06-05 DIAGNOSIS — I2510 Atherosclerotic heart disease of native coronary artery without angina pectoris: Secondary | ICD-10-CM

## 2019-06-05 DIAGNOSIS — R0600 Dyspnea, unspecified: Secondary | ICD-10-CM

## 2019-06-05 DIAGNOSIS — R0602 Shortness of breath: Secondary | ICD-10-CM | POA: Diagnosis not present

## 2019-06-05 DIAGNOSIS — I10 Essential (primary) hypertension: Secondary | ICD-10-CM | POA: Diagnosis not present

## 2019-06-05 DIAGNOSIS — R0609 Other forms of dyspnea: Secondary | ICD-10-CM

## 2019-06-05 DIAGNOSIS — Z720 Tobacco use: Secondary | ICD-10-CM

## 2019-06-05 NOTE — Patient Instructions (Signed)
Medication Instructions:  Your physician recommends that you continue on your current medications as directed. Please refer to the Current Medication list given to you today.  *If you need a refill on your cardiac medications before your next appointment, please call your pharmacy*  Lab Work: BMET & PRO BNP - Today   If you have labs (blood work) drawn today and your tests are completely normal, you will receive your results only by: Marland Kitchen MyChart Message (if you have MyChart) OR . A paper copy in the mail If you have any lab test that is abnormal or we need to change your treatment, we will call you to review the results.  Testing/Procedures: Your physician had ordered for you to have a chest X-Ray at [facility]: You are scheduled to see Cecilie Kicks, NP on 06/20/2019 @ 11:30 AM  Other Instructions None

## 2019-06-06 LAB — PRO B NATRIURETIC PEPTIDE: NT-Pro BNP: 189 pg/mL — ABNORMAL HIGH (ref 0–121)

## 2019-06-06 LAB — BASIC METABOLIC PANEL
BUN/Creatinine Ratio: 8 — ABNORMAL LOW (ref 9–20)
BUN: 8 mg/dL (ref 6–24)
CO2: 23 mmol/L (ref 20–29)
Calcium: 9.5 mg/dL (ref 8.7–10.2)
Chloride: 99 mmol/L (ref 96–106)
Creatinine, Ser: 1.04 mg/dL (ref 0.76–1.27)
GFR calc Af Amer: 96 mL/min/{1.73_m2} (ref >59)
GFR calc non Af Amer: 83 mL/min/{1.73_m2} (ref >59)
Glucose: 118 mg/dL — ABNORMAL HIGH (ref 65–99)
Potassium: 4.6 mmol/L (ref 3.5–5.2)
Sodium: 137 mmol/L (ref 134–144)

## 2019-06-10 ENCOUNTER — Telehealth (HOSPITAL_COMMUNITY): Payer: Self-pay

## 2019-06-10 NOTE — Telephone Encounter (Signed)
No response from pt regarding CR.  Closed referral.  

## 2019-06-10 NOTE — Addendum Note (Signed)
Addended by: Mendel Ryder on: 06/10/2019 09:45 AM   Modules accepted: Orders

## 2019-06-11 ENCOUNTER — Encounter: Payer: Self-pay | Admitting: *Deleted

## 2019-06-20 ENCOUNTER — Ambulatory Visit
Admission: RE | Admit: 2019-06-20 | Discharge: 2019-06-20 | Disposition: A | Discharge status: Home / Self Care | Payer: BC Managed Care – PPO | Source: Ambulatory Visit | Attending: Cardiology | Admitting: Cardiology

## 2019-06-20 ENCOUNTER — Telehealth: Payer: Self-pay | Admitting: Cardiology

## 2019-06-20 ENCOUNTER — Ambulatory Visit (INDEPENDENT_AMBULATORY_CARE_PROVIDER_SITE_OTHER): Payer: BC Managed Care – PPO | Admitting: Cardiology

## 2019-06-20 ENCOUNTER — Other Ambulatory Visit: Payer: Self-pay

## 2019-06-20 ENCOUNTER — Encounter: Payer: Self-pay | Admitting: Cardiology

## 2019-06-20 VITALS — BP 122/70 | HR 80 | Ht 69.0 in | Wt 270.4 lb

## 2019-06-20 DIAGNOSIS — R0602 Shortness of breath: Secondary | ICD-10-CM

## 2019-06-20 DIAGNOSIS — Z720 Tobacco use: Secondary | ICD-10-CM | POA: Diagnosis not present

## 2019-06-20 DIAGNOSIS — E782 Mixed hyperlipidemia: Secondary | ICD-10-CM

## 2019-06-20 DIAGNOSIS — R0600 Dyspnea, unspecified: Secondary | ICD-10-CM | POA: Diagnosis not present

## 2019-06-20 DIAGNOSIS — R0609 Other forms of dyspnea: Secondary | ICD-10-CM

## 2019-06-20 DIAGNOSIS — I10 Essential (primary) hypertension: Secondary | ICD-10-CM

## 2019-06-20 DIAGNOSIS — I2510 Atherosclerotic heart disease of native coronary artery without angina pectoris: Secondary | ICD-10-CM

## 2019-06-20 MED ORDER — CLOPIDOGREL BISULFATE 75 MG PO TABS: 75.0000 mg | ORAL_TABLET | ORAL | 3 refills | Status: DC

## 2019-06-20 NOTE — Telephone Encounter (Signed)
Encounter not need  

## 2019-06-20 NOTE — Patient Instructions (Addendum)
Medication Instructions:   Your physician has recommended you make the following change in your medication:   1) Stop Brilinta after your last evening dose in 3 days. The following day start Plavix, your first dose will be 300MG  (4 tablets), then you will take 1 tablet by mouth once a day.  *If you need a refill on your cardiac medications before your next appointment, please call your pharmacy*  Lab Work:  Your physician recommends that you return for lab work on 07/04/19 at your office visit with 07/06/19. We will check a CMET and lipid panel.  Testing/Procedures:  None ordered today  Follow-Up: At [facility], you and your health needs are our priority.  As part of our continuing mission to provide you with exceptional heart care, we have created designated Provider Care Teams.  These Care Teams include your primary Cardiologist (physician) and Advanced Practice Providers (APPs -  Physician Assistants and Nurse Practitioners) who all work together to provide you with the care you need, when you need it.  Your next appointment:    Follow up with [facility] on 07/04/19 at 11:15AM

## 2019-06-20 NOTE — Progress Notes (Signed)
Cardiology Office Note   Date:  06/20/2019   ID:  Leisa Lenz., DOB [birth year], MRN [id number]  PCP:  Irena Reichmann, DO  Cardiologist:  Dr. Eldridge Dace     Chief Complaint  Patient presents with  . Coronary Artery Disease      History of Present Illness: Bascom Biel. is a 49 y.o. male who presents for CAD and SOB related to Brilinta.  history of HTN admitted with chest pain.He complained ofacute onset mid chest pressure/gas like indigestion feeling 'in the middle of the day', at rest, lasted an hour or so and went away. Later thatevening, at home, after showering, felt similar pain/discomfort, mid chest, radiating to left arm, with sob. Symptomswereacute onset, moderate, persistent, constant, dull/pressure. Stated symptoms weresimilar milder/briefer pain in past. No prior cardiac cath. +fam hx cad. No pleuritic pain. No leg pain or swelling. No chest wall injury or strain. No cough or uri symptoms. No fever or chills. No known covid + exposure.EKG notedwith inferolateral STEMI.  DES x1 placed to 2nd OM. Residual disease with 50% in the mLAD planned for medical management. Placed on DAPT with ASA/Brilinta for at least one year. Follow up echo showedLVEF=50-55%, mild hypokinesis of the basal mid inferolateral wall. He was able to tolerate the addition of metoprolol 25mg  BID and lisinopril 2.5mg  daily. Worked well with cardiac rehab without recurrent chest pain. CM check on Brilinta noted an initial Copay around $100. Patient instructed to let know if cost remains elevated. May need to switch to plavix after 30 days. Educated by PharmD prior to discharge.pk troponin hs 4,923  Last visit. he has no chest pain, he is dyspneic with exertion he stops and rests and it resolves.  Does not awaken from sleep with SOB.  No swelling.  No CXR in Epic.  Pt did smoke but none since hospital -Congratulated.   He is walking 1/4 mile to 1/2 mile daily.  But again stops if SOB.  Eating  healthier.  His glucose was elevated at times, but no HgbA1c drawn.  Will check on next visit.  LDL was 130 and pt placed on lipitor 80, will recheck in 6 weeks.   Here to convert to plavix and eval for return to work.  CXR and BNP normal.  SOB due to Brilinta most likely.  He continues with SOB, has 3 more days of Brilinta then will change to plavix.  No angina.  He is walking. Eating more fiber with increase in BMs and some rectal bleeding.  He has hx of hemorrhoids with surgery, will add metamucil.  He still cannot do much activity without SOB, he is exercising an hour per day.  No angina.     Past Medical History:  Diagnosis Date  . Acute MI, lateral wall (HCC) 05/21/2019  . Coronary artery disease   . Hyperlipidemia   . Hypertension   . S/P angioplasty with stent to OM2  DES 05/21/2019  . STEMI (ST elevation myocardial infarction) (HCC) 05/21/2019  . Tobacco use     Past Surgical History:  Procedure Laterality Date  . CORONARY/GRAFT ACUTE MI REVASCULARIZATION N/A 05/21/2019   Procedure: Coronary/Graft Acute MI Revascularization;  Surgeon: 14/01/2019, MD;  Location: [facility] INVASIVE CV LAB;  Service: Cardiovascular;  Laterality: N/A;  . KNEE ARTHROSCOPY    . LEFT HEART CATH AND CORONARY ANGIOGRAPHY N/A 05/21/2019   Procedure: LEFT HEART CATH AND CORONARY ANGIOGRAPHY;  Surgeon: 14/01/2019, MD;  Location: [facility] INVASIVE CV LAB;  Service: Cardiovascular;  Laterality: N/A;  . SHOULDER SURGERY       Current Outpatient Medications  Medication Sig Dispense Refill  . allopurinol (ZYLOPRIM) 300 MG tablet Take 300 mg by mouth daily.     Marland Kitchen aspirin 81 MG chewable tablet Chew 1 tablet (81 mg total) by mouth daily. 90 tablet 1  . atorvastatin (LIPITOR) 80 MG tablet Take 1 tablet (80 mg total) by mouth daily at 6 PM. 90 tablet 1  . lisinopril (ZESTRIL) 2.5 MG tablet Take 1 tablet (2.5 mg total) by mouth daily. 90 tablet 1  . metoprolol tartrate (LOPRESSOR) 25 MG tablet Take 1 tablet  (25 mg total) by mouth 2 (two) times daily. 180 tablet 1  . MITIGARE 0.6 MG CAPS Take 0.6 mg by mouth as needed (gout).     . nitroGLYCERIN (NITROSTAT) 0.4 MG SL tablet Place 1 tablet (0.4 mg total) under the tongue every 5 (five) minutes as needed. 25 tablet 2  . ticagrelor (BRILINTA) 90 MG TABS tablet Take 1 tablet (90 mg total) by mouth 2 (two) times daily. 180 tablet 1   No current facility-administered medications for this visit.    Allergies:   Morphine and related    Social History:  The patient  reports that he has been smoking cigarettes. He has been smoking about 1.00 pack per day. His smokeless tobacco use includes snuff. He reports that he does not drink alcohol or use drugs.   Family History:  The patient's family history includes Diabetes in his mother.    ROS:  General:no colds or fevers, no weight changes Skin:no rashes or ulcers HEENT:no blurred vision, no congestion CV:see HPI PUL:see HPI GI:no diarrhea +constipation no melena- some bright blood with stool , no indigestion GU:no hematuria, no dysuria MS:no joint pain, no claudication Neuro:no syncope, no lightheadedness Endo:no diabetes, no thyroid disease  Wt Readings from Last 3 Encounters:  06/20/19 270 lb 6.4 oz (122.7 kg)  06/05/19 274 lb (124.3 kg)  05/21/19 272 lb (123.4 kg)     PHYSICAL EXAM: VS:  BP 122/70   Pulse 80   Ht 5\' 9"  (1.753 m)   Wt 270 lb 6.4 oz (122.7 kg)   SpO2 98%   BMI 39.93 kg/m  , BMI Body mass index is 39.93 kg/m. General:Pleasant affect, NAD Skin:Warm and dry, brisk capillary refill HEENT:normocephalic, sclera clear, mucus membranes moist Neck:supple, no JVD, no bruits  Heart:S1S2 RRR without murmur, gallup, rub or click Lungs:clear without rales, rhonchi, or wheezes , non tender, + BS, do not palpate liver spleen or masses Ext:no lower ext edema, 2+ pedal pulses, 2+ radial pulses Neuro:alert and oriented X 3, MAE, follows commands, + facial symmetry    EKG:   EKG is NOT ordered today.    Recent Labs: 05/21/2019: ALT 24 05/22/2019: Hemoglobin 15.1; Platelets 272 06/05/2019: BUN 8; Creatinine, Ser 1.04; NT-Pro BNP 189; Potassium 4.6; Sodium 137    Lipid Panel    Component Value Date/Time   CHOL 228 (H) 05/21/2019 2205   TRIG 311 (H) 05/21/2019 2205   HDL 36 (L) 05/21/2019 2205   CHOLHDL 6.3 05/21/2019 2205   VLDL 62 (H) 05/21/2019 2205   LDLCALC 130 (H) 05/21/2019 2205       Other studies Reviewed: Additional studies/ records that were reviewed today include: . Cath: 05/21/19   Mid LAD lesion is 50% stenosed.  Mid RCA lesion is 25% stenosed.  2nd Mrg lesion is 99% stenosed.  A drug-eluting stent was successfully placed  using a STENT RESOLUTE ONYX 2.0X22.  Post intervention, there is a 0% residual stenosis.  The left ventricular systolic function is normal.  LV end diastolic pressure is normal.  The left ventricular ejection fraction is 50-55% by visual estimate.  There is no aortic valve stenosis.  Short aortic arch making JL3.5 difficult to use. EBU 3 Guide catheter was used to engage left main/LAD. EBU 3.5 may be a better choice if PCI was needed.  Continue aggressive secondary prevention along with DAPT.   We spoke about stopping smoking and other lifestyle modifications.   I informed his aunt of the findings.   Diagnostic Dominance: Right  Intervention     Echo 05/22/19 IMPRESSIONS   1. Left ventricular ejection fraction, by visual estimation, is 50 to 55%. The left ventricle has normal function. There is no left ventricular hypertrophy. 2. Mild hypokinesis of the left ventricular, basal-mid inferolateral wall. 3. Left ventricular diastolic parameters are consistent with Grade I diastolic dysfunction (impaired relaxation). 4. The left ventricle demonstrates regional wall motion abnormalities. 5. Global right ventricle has normal systolic function.The right ventricular size is normal. No  increase in right ventricular wall thickness. 6. Left atrial size was normal. 7. Right atrial size was normal. 8. Presence of pericardial fat pad. 9. Trivial pericardial effusion is present. 10. The mitral valve is grossly normal. Trivial mitral valve regurgitation. 11. The tricuspid valve is grossly normal. Tricuspid valve regurgitation is trivial. 12. The aortic valve is tricuspid. Aortic valve regurgitation is not visualized. 13. The pulmonic valve was grossly normal. Pulmonic valve regurgitation is not visualized. 14. TR signal is inadequate for assessing pulmonary artery systolic pressure. 15. The inferior vena cava is normal in size with greater than 50% respiratory variability, suggesting right atrial pressure of 3 mmHg. 16. No prior Echocardiogram.  FINDINGS Left Ventricle: Left ventricular ejection fraction, by visual estimation, is 50 to 55%. The left ventricle has normal function. Mild hypokinesis of the left ventricular, basal-mid inferolateral wall. The left ventricle demonstrates regional wall motion  abnormalities. The left ventricular internal cavity size was the left ventricle is normal in size. There is no left ventricular hypertrophy. Left ventricular diastolic parameters are consistent with Grade I diastolic dysfunction (impaired relaxation).  Normal left atrial pressure.  Right Ventricle: The right ventricular size is normal. No increase in right ventricular wall thickness. Global RV systolic function is has normal systolic function.  Left Atrium: Left atrial size was normal in size.  Right Atrium: Right atrial size was normal in size  Pericardium: Trivial pericardial effusion is present. Presence of pericardial fat pad.  Mitral Valve: The mitral valve is grossly normal. Trivial mitral valve regurgitation.  Tricuspid Valve: The tricuspid valve is grossly normal. Tricuspid valve regurgitation is trivial.  Aortic Valve: The aortic valve is tricuspid.  Aortic valve regurgitation is not visualized. There is mild calcification of the aortic valve.  Pulmonic Valve: The pulmonic valve was grossly normal. Pulmonic valve regurgitation is not visualized. Pulmonic regurgitation is not visualized.  Aorta: The aortic root is normal in size and structure.  Venous: The inferior vena cava is normal in size with greater than 50% respiratory variability, suggesting right atrial pressure of 3 mmHg.  IAS/Shunts: No atrial level shunt detected by color flow Doppler.    ASSESSMENT AND PLAN:  1.  SOB/DOE due to Brilinta with normal pro BNP, and CXR, he has 3 days left on brilinta and will convert to plavix with 300 mg load.  Then 75 mg daily.  Will see back in 2 weeks - symptoms should have resolved by then and hope he can return to work, I kept him out 4 more weeks to be sure.    2.  HLD will need lipids on next visit goal LDL < 70   3.  Tobacco use - has stopped and doing well.    4.  HTN controlled,  5.  CAD with residual disease in RCA and LAD treating medically and recent stent -DES to second OM branch of LCX with acute STEMI.    Current medicines are reviewed with the patient today.  The patient Has no concerns regarding medicines.  The following changes have been made:  See above Labs/ tests ordered today include:see above  Disposition:   FU:  see above  Signed, Nada Boozer, NP  06/20/2019 11:51 AM    [facility] [address] [place], [place], [place]  [address]/ [address] [place], [place] Phone: [phone]; Fax: [phone]  [phone]

## 2019-06-22 ENCOUNTER — Other Ambulatory Visit: Payer: Self-pay | Admitting: Physician Assistant

## 2019-06-22 DIAGNOSIS — I10 Essential (primary) hypertension: Secondary | ICD-10-CM

## 2019-06-22 DIAGNOSIS — I2510 Atherosclerotic heart disease of native coronary artery without angina pectoris: Secondary | ICD-10-CM

## 2019-06-22 MED ORDER — METOPROLOL TARTRATE 25 MG PO TABS: 25.0000 mg | ORAL_TABLET | Freq: Two times a day (BID) | ORAL | 1 refills | Status: DC

## 2019-06-22 MED ORDER — ATORVASTATIN CALCIUM 80 MG PO TABS: 80.0000 mg | ORAL_TABLET | Freq: Every day | ORAL | 1 refills | Status: DC

## 2019-06-22 MED ORDER — LISINOPRIL 2.5 MG PO TABS: 2.5000 mg | ORAL_TABLET | Freq: Every day | ORAL | 1 refills | Status: DC

## 2019-06-26 ENCOUNTER — Telehealth: Payer: Self-pay | Admitting: Cardiology

## 2019-06-26 NOTE — Telephone Encounter (Signed)
FMLA / disability form received from [employer]. Placed in box for Nada Boozer, NP to review. 06/26/19 vlm

## 2019-06-28 NOTE — Progress Notes (Signed)
Cardiology Office Note   Date:  07/04/2019   ID:  Timothy Ivory., DOB 1970/08/03, MRN [id number]  PCP:  Janie Morning, DO  Cardiologist:  Dr. Irish Lack, MD   Chief Complaint  Patient presents with  . Follow-up    History of Present Illness: Timothy Kutzer. is a 49 y.o. male who presents for 2 week follow up for CAD and SOB follow up, seen for Dr. Irish Lack.   Timothy [name] has a hx of CAD s/p STEMI with DES/PCI to OM2 with residual disease in mLAD at 50% with plans for medical management per cath performed 05/21/19. He was placed on DAPT with ASA and Brilinta for at least one year. F/U echo showed LVEF of 50-55% with mild hypokinesis of the basal mid inferolateral wall.   He was then seen in f/u 06/05/19 and was doing well. He has some SOB with exertion felt to be stable. BNP and CXR performed without concern. He was exercising in the post PCI setting and was noted to be eating a better, more heart healthy diet. LDL was noted to be 130.   He was eventually transitioned to Plavix secondary to cost. SOB was though to also be related to Brilinta.   He is here today for follow up and to perform Lipid panel and CMET.  He reports he has been doing very well from a CV standpoint.  Is now walking approximately 1 hour/day without anginal symptoms.  Reports he is interested in cardiac rehabilitation however discussed that this is currently on hold.  We did offer him a cardiac rehab through [facility] regional [facility] which she is interested in.  I will fill out this paperwork for him.  Reports his shortness of breath has improved with the switch from Brilinta to Plavix however states that he has been having more frequent headaches.  I recommend that he try Tylenol to assess for improvement.  He is asking about return to work.  In speaking with him today, he reports that he works a very physical job with extreme work conditions with very high temperatures.  I suggested he hold off for another 2 to 4 weeks  which would put him approximately 12 weeks out from his STEMI.  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  Reports he has not smoked since his heart attack and has been eating better.  Congratulated  Past Medical History:  Diagnosis Date  . Acute MI, lateral wall ([place]) 05/21/2019  . Coronary artery disease   . Hyperlipidemia   . Hypertension   . S/P angioplasty with stent to OM2  DES 05/21/2019  . STEMI (ST elevation myocardial infarction) ([place]) 05/21/2019  . Tobacco use     Past Surgical History:  Procedure Laterality Date  . CORONARY/GRAFT ACUTE MI REVASCULARIZATION N/A 05/21/2019   Procedure: Coronary/Graft Acute MI Revascularization;  Surgeon: Jettie Booze, MD;  Location: [place] CV LAB;  Service: Cardiovascular;  Laterality: N/A;  . KNEE ARTHROSCOPY    . LEFT HEART CATH AND CORONARY ANGIOGRAPHY N/A 05/21/2019   Procedure: LEFT HEART CATH AND CORONARY ANGIOGRAPHY;  Surgeon: Jettie Booze, MD;  Location: [place] CV LAB;  Service: Cardiovascular;  Laterality: N/A;  . SHOULDER SURGERY       Current Outpatient Medications  Medication Sig Dispense Refill  . allopurinol (ZYLOPRIM) 300 MG tablet Take 300 mg by mouth daily.     Marland Kitchen  aspirin 81 MG chewable tablet Chew 1 tablet (81 mg total) by mouth daily. 90 tablet 1  . atorvastatin (LIPITOR) 80 MG tablet Take 1 tablet (80 mg total) by mouth daily at 6 PM. 90 tablet 1  . clopidogrel (PLAVIX) 75 MG tablet Take 75 mg by mouth daily.    Marland Kitchen lisinopril (ZESTRIL) 2.5 MG tablet Take 1 tablet (2.5 mg total) by mouth daily. 90 tablet 1  . metoprolol tartrate (LOPRESSOR) 25 MG tablet Take 1 tablet (25 mg total) by mouth 2 (two) times daily. 180 tablet 1  . MITIGARE 0.6 MG CAPS Take 0.6 mg by mouth as needed (gout).     . nitroGLYCERIN (NITROSTAT) 0.4 MG SL tablet Place 1 tablet (0.4 mg total) under the tongue every 5 (five) minutes as needed. 25  tablet 2   No current facility-administered medications for this visit.    Allergies:   Morphine and related    Social History:  The patient  reports that he has been smoking cigarettes. He has been smoking about 1.00 pack per day. His smokeless tobacco use includes snuff. He reports that he does not drink alcohol or use drugs.   Family History:  The patient's family history includes Diabetes in his mother.    ROS:  Please see the history of present illness. Otherwise, review of systems are positive for none.   All other systems are reviewed and negative.    PHYSICAL EXAM: VS:  BP 122/78   Pulse 85   Ht 5' 9.5" (1.765 m)   Wt 274 lb 6.4 oz (124.5 kg)   SpO2 96%   BMI 39.94 kg/m  , BMI Body mass index is 39.94 kg/m.   General: Well developed, well nourished, NAD Neck: Negative for carotid bruits. No JVD Lungs:Clear to ausculation bilaterally. No wheezes, rales, or rhonchi. Breathing is unlabored. Cardiovascular: RRR with S1 S2. No murmur Extremities: No edema. DP pulses 2+ bilaterally Neuro: Alert and oriented. No focal deficits. No facial asymmetry. MAE spontaneously. Psych: Responds to questions appropriately with normal affect.     EKG:  EKG is not ordered today.   Recent Labs: 05/21/2019: ALT 24 05/22/2019: Hemoglobin 15.1; Platelets 272 06/05/2019: BUN 8; Creatinine, Ser 1.04; NT-Pro BNP 189; Potassium 4.6; Sodium 137    Lipid Panel    Component Value Date/Time   CHOL 228 (H) 05/21/2019 2205   TRIG 311 (H) 05/21/2019 2205   HDL 36 (L) 05/21/2019 2205   CHOLHDL 6.3 05/21/2019 2205   VLDL 62 (H) 05/21/2019 2205   LDLCALC 130 (H) 05/21/2019 2205    Wt Readings from Last 3 Encounters:  07/04/19 274 lb 6.4 oz (124.5 kg)  06/20/19 270 lb 6.4 oz (122.7 kg)  06/05/19 274 lb (124.3 kg)     Other studies Reviewed: Additional studies/ records that were reviewed today include:   Cath: 05/21/19   Mid LAD lesion is 50% stenosed.  Mid RCA lesion is 25%  stenosed.  2nd Mrg lesion is 99% stenosed.  A drug-eluting stent was successfully placed using a STENT RESOLUTE ONYX 2.0X22.  Post intervention, there is a 0% residual stenosis.  The left ventricular systolic function is normal.  LV end diastolic pressure is normal.  The left ventricular ejection fraction is 50-55% by visual estimate.  There is no aortic valve stenosis.  Short aortic arch making JL3.5 difficult to use. EBU 3 Guide catheter was used to engage left main/LAD. EBU 3.5 may be a better choice if PCI was needed.  Continue aggressive  secondary prevention along with DAPT.   We spoke about stopping smoking and other lifestyle modifications.   I informed his aunt of the findings.   Diagnostic Dominance: Right  Intervention     Echo 05/22/19 IMPRESSIONS   1. Left ventricular ejection fraction, by visual estimation, is 50 to 55%. The left ventricle has normal function. There is no left ventricular hypertrophy. 2. Mild hypokinesis of the left ventricular, basal-mid inferolateral wall. 3. Left ventricular diastolic parameters are consistent with Grade I diastolic dysfunction (impaired relaxation). 4. The left ventricle demonstrates regional wall motion abnormalities. 5. Global right ventricle has normal systolic function.The right ventricular size is normal. No increase in right ventricular wall thickness. 6. Left atrial size was normal. 7. Right atrial size was normal. 8. Presence of pericardial fat pad. 9. Trivial pericardial effusion is present. 10. The mitral valve is grossly normal. Trivial mitral valve regurgitation. 11. The tricuspid valve is grossly normal. Tricuspid valve regurgitation is trivial. 12. The aortic valve is tricuspid. Aortic valve regurgitation is not visualized. 13. The pulmonic valve was grossly normal. Pulmonic valve regurgitation is not visualized. 14. TR signal is inadequate for assessing pulmonary artery systolic  pressure. 15. The inferior vena cava is normal in size with greater than 50% respiratory variability, suggesting right atrial pressure of 3 mmHg. 16. No prior Echocardiogram.  FINDINGS Left Ventricle: Left ventricular ejection fraction, by visual estimation, is 50 to 55%. The left ventricle has normal function. Mild hypokinesis of the left ventricular, basal-mid inferolateral wall. The left ventricle demonstrates regional wall motion  abnormalities. The left ventricular internal cavity size was the left ventricle is normal in size. There is no left ventricular hypertrophy. Left ventricular diastolic parameters are consistent with Grade I diastolic dysfunction (impaired relaxation).  Normal left atrial pressure.  Right Ventricle: The right ventricular size is normal. No increase in right ventricular wall thickness. Global RV systolic function is has normal systolic function.  Left Atrium: Left atrial size was normal in size.  Right Atrium: Right atrial size was normal in size  Pericardium: Trivial pericardial effusion is present. Presence of pericardial fat pad.  Mitral Valve: The mitral valve is grossly normal. Trivial mitral valve regurgitation.  Tricuspid Valve: The tricuspid valve is grossly normal. Tricuspid valve regurgitation is trivial.  Aortic Valve: The aortic valve is tricuspid. Aortic valve regurgitation is not visualized. There is mild calcification of the aortic valve.  Pulmonic Valve: The pulmonic valve was grossly normal. Pulmonic valve regurgitation is not visualized. Pulmonic regurgitation is not visualized.  Aorta: The aortic root is normal in size and structure.  Venous: The inferior vena cava is normal in size with greater than 50% respiratory variability, suggesting right atrial pressure of 3 mmHg.  IAS/Shunts: No atrial level shunt detected by color flow Doppler.   ASSESSMENT AND PLAN:  1. SOB/DOE: -Improved with the transition from Brilinta to  Plavix therapies -Reports frequent headaches on Plavix with recommendations for intermittent Tylenol to assess for improvement -Currently able to walk 1 hour/day without anginal symptoms or shortness of breath  2.  CAD with residual disease in RCA and LAD: -Treating medically, PCI/DES to second OM branch of the LCx with acute STEMI  -Denies recurrent angina -Is asking about cardiac rehabilitation and it appears there not offering this service at this time however will refer to him to [facility] given his interest -Continue ASA, Plavix, statin, BB   2.  HLD: -Repeat lipid panel, CMET today -LDL goal <70 -Continue statin   3.  Tobacco use: -Complete cessation since 05/2019 -Congratulated on this   4.  HTN: -Stable today -Continue current regimen   5.  CAD with residual disease in RCA and LAD: -Treating medically and recent stent -DES to second OM branch of LCX with acute STEMI.    Current medicines are reviewed at length with the patient today.  The patient does not have concerns regarding medicines.  The following changes have been made:  no change  Labs/ tests ordered today include: CMET, lipid No orders of the defined types were placed in this encounter.   Disposition:   FU with APP in 6 weeks  Signed, Georgie Chard, NP  07/04/2019 11:36 AM    [facility] [address] [place], [place], [place]  [address] Phone: [phone]; Fax: [phone]

## 2019-07-04 ENCOUNTER — Ambulatory Visit (INDEPENDENT_AMBULATORY_CARE_PROVIDER_SITE_OTHER): Payer: BC Managed Care – PPO | Admitting: Cardiology

## 2019-07-04 ENCOUNTER — Other Ambulatory Visit: Payer: Self-pay

## 2019-07-04 ENCOUNTER — Encounter: Payer: Self-pay | Admitting: Cardiology

## 2019-07-04 VITALS — BP 122/78 | HR 85 | Ht 69.5 in | Wt 274.4 lb

## 2019-07-04 DIAGNOSIS — I10 Essential (primary) hypertension: Secondary | ICD-10-CM

## 2019-07-04 DIAGNOSIS — E782 Mixed hyperlipidemia: Secondary | ICD-10-CM | POA: Diagnosis not present

## 2019-07-04 DIAGNOSIS — I2510 Atherosclerotic heart disease of native coronary artery without angina pectoris: Secondary | ICD-10-CM | POA: Diagnosis not present

## 2019-07-04 DIAGNOSIS — Z79899 Other long term (current) drug therapy: Secondary | ICD-10-CM | POA: Diagnosis not present

## 2019-07-04 LAB — LIPID PANEL
Chol/HDL Ratio: 3.8 ratio (ref 0.0–5.0)
Cholesterol, Total: 132 mg/dL (ref 100–199)
HDL: 35 mg/dL — ABNORMAL LOW (ref >39)
LDL Chol Calc (NIH): 71 mg/dL (ref 0–99)
Triglycerides: 152 mg/dL — ABNORMAL HIGH (ref 0–149)
VLDL Cholesterol Cal: 26 mg/dL (ref 5–40)

## 2019-07-04 LAB — COMPREHENSIVE METABOLIC PANEL
ALT: 32 IU/L (ref 0–44)
AST: 19 IU/L (ref 0–40)
Albumin/Globulin Ratio: 1.7 (ref 1.2–2.2)
Albumin: 4.4 g/dL (ref 4.0–5.0)
Alkaline Phosphatase: 132 IU/L — ABNORMAL HIGH (ref 39–117)
BUN/Creatinine Ratio: 10 (ref 9–20)
BUN: 11 mg/dL (ref 6–24)
Bilirubin Total: 0.8 mg/dL (ref 0.0–1.2)
CO2: 24 mmol/L (ref 20–29)
Calcium: 9.4 mg/dL (ref 8.7–10.2)
Chloride: 100 mmol/L (ref 96–106)
Creatinine, Ser: 1.13 mg/dL (ref 0.76–1.27)
GFR calc Af Amer: 87 mL/min/{1.73_m2} (ref >59)
GFR calc non Af Amer: 75 mL/min/{1.73_m2} (ref >59)
Globulin, Total: 2.6 g/dL (ref 1.5–4.5)
Glucose: 128 mg/dL — ABNORMAL HIGH (ref 65–99)
Potassium: 4.5 mmol/L (ref 3.5–5.2)
Sodium: 138 mmol/L (ref 134–144)
Total Protein: 7 g/dL (ref 6.0–8.5)

## 2019-07-04 NOTE — Patient Instructions (Signed)
Medication Instructions:   Your physician recommends that you continue on your current medications as directed. Please refer to the Current Medication list given to you today.  *If you need a refill on your cardiac medications before your next appointment, please call your pharmacy*  Lab Work:  You will have labs drawn today: Lipid/CMET  If you have labs (blood work) drawn today and your tests are completely normal, you will receive your results only by: Marland Kitchen MyChart Message (if you have MyChart) OR . A paper copy in the mail If you have any lab test that is abnormal or we need to change your treatment, we will call you to review the results.  Testing/Procedures:  None ordered today  Follow-Up: At [facility], you and your health needs are our priority.  As part of our continuing mission to provide you with exceptional heart care, we have created designated Provider Care Teams.  These Care Teams include your primary Cardiologist (physician) and Advanced Practice Providers (APPs -  Physician Assistants and Nurse Practitioners) who all work together to provide you with the care you need, when you need it.  Your next appointment:    On 08/21/19 at 10:15AM with Nada Boozer, NP    .

## 2019-07-08 ENCOUNTER — Telehealth: Payer: Self-pay

## 2019-07-08 NOTE — Telephone Encounter (Signed)
-----   Message from Filbert Schilder, NP sent at 07/05/2019  2:20 PM EST ----- Please let the patient know that his kidney function is stable.  His triglycerides have improved from 1 month ago.  Was previously 311 now down to 152.  Let us keep going with our current plan.  Great job.

## 2019-07-08 NOTE — Telephone Encounter (Signed)
The patient has been notified of the lab  results and verbalized understanding.  All questions (if any) were answered. Sigurd Sos, RN 07/08/2019 9:54 AM

## 2019-07-16 ENCOUNTER — Telehealth: Payer: Self-pay | Admitting: Interventional Cardiology

## 2019-07-16 NOTE — Telephone Encounter (Signed)
Patient is requesting a letter stating that he is able to return to work on 07/22/19. Patient states that he is able to come into the office to pick up the letter tomorrow, 07/17/19. Please call to confirm.

## 2019-07-17 NOTE — Telephone Encounter (Signed)
Return to work note printed and signed by Georgie Chard. Patient is aware that note is ready for pick up.

## 2019-07-24 DIAGNOSIS — Z1211 Encounter for screening for malignant neoplasm of colon: Secondary | ICD-10-CM | POA: Diagnosis not present

## 2019-07-24 DIAGNOSIS — R109 Unspecified abdominal pain: Secondary | ICD-10-CM | POA: Diagnosis not present

## 2019-07-24 DIAGNOSIS — R197 Diarrhea, unspecified: Secondary | ICD-10-CM | POA: Diagnosis not present

## 2019-07-24 DIAGNOSIS — R112 Nausea with vomiting, unspecified: Secondary | ICD-10-CM | POA: Diagnosis not present

## 2019-08-20 NOTE — Progress Notes (Signed)
Cardiology Office Note   Date:  08/21/2019   ID:  Leisa Lenz., DOB 12-27-1970, MRN [id number]  PCP:  Irena Reichmann, DO  Cardiologist:  Dr. Eldridge Dace     Chief Complaint  Patient presents with  . Coronary Artery Disease      History of Present Illness: Timothy [name]. is a 49 y.o. male who presents for CAD.   He has a history of HTN admitted with chest pain.He complained ofacute onset mid chest pressure/gas like indigestion feeling 'in the middle of the day', at rest, lasted an hour or so and went away. Later thatevening, at home, after showering, felt similar pain/discomfort, mid chest, radiating to left arm, with sob. Symptomswereacute onset, moderate, persistent, constant, dull/pressure. Stated symptoms weresimilar milder/briefer pain in past. No prior cardiac cath. +fam hx cad. No pleuritic pain. No leg pain or swelling. No chest wall injury or strain. No cough or uri symptoms. No fever or chills. No known covid + exposure.EKG notedwith inferolateral STEMI.  DES x1 placed to 2nd OM. Residual disease with 50% in the mLAD planned for medical management. Placed on DAPT with ASA/Brilinta for at least one year. Follow up echo showedLVEF=50-55%, mild hypokinesis of the basal mid inferolateral wall. He was able to tolerate the addition of metoprolol 25mg  BID and lisinopril 2.5mg  daily. Worked well with cardiac rehab without recurrent chest pain. CM check on Brilinta noted an initial Copay around $100. Patient instructed to let know if cost remains elevated. May need to switch to plavix after 30 days. Educated by PharmD prior to discharge.pk troponin hs 4,923  Last visit.he has no chest pain, he is dyspneic with exertion he stops and rests and it resolves. Does not awaken from sleep with SOB. No swelling. No CXR in Epic. Pt did smoke but none since hospital -Congratulated. He is walking 1/4 mile to 1/2 mile daily. But again stops if SOB. Eating healthier. His glucose  was elevated at times, but no HgbA1c drawn. Will check on next visit. LDL was 130 and pt placed on lipitor 80, will recheck in 6 weeks.   06/20/2019 to convert to plavix and eval for return to work.  CXR and BNP normal.  SOB due to Brilinta most likely.  He continues with SOB, has 3 more days of Brilinta then will change to plavix.  No angina.  He is walking. Eating more fiber with increase in BMs and some rectal bleeding.  He has hx of hemorrhoids with surgery, will add metamucil.  He still cannot do much activity without SOB, he is exercising an hour per day.  No angina.    Seen last 07/04/19 and was improved.  Pt returned to work 07/22/19.   Today he is doing well. No chest pain, though he did have some chest tightness.  Brief.  No need no SOB.  He is back at work and doing ok.  He has chronic diarrhea. Followed by PCP,  He has completely stopped tobacco. Congratulated.   Continue exercise. No change in meds.      Past Medical History:  Diagnosis Date  . Acute MI, lateral wall (HCC) 05/21/2019  . Coronary artery disease   . Hyperlipidemia   . Hypertension   . S/P angioplasty with stent to OM2  DES 05/21/2019  . STEMI (ST elevation myocardial infarction) (HCC) 05/21/2019  . Tobacco use     Past Surgical History:  Procedure Laterality Date  . CORONARY/GRAFT ACUTE MI REVASCULARIZATION N/A 05/21/2019   Procedure: Coronary/Graft  Acute MI Revascularization;  Surgeon: Jettie Booze, MD;  Location: [facility];  Service: Cardiovascular;  Laterality: N/A;  . KNEE ARTHROSCOPY    . LEFT HEART CATH AND CORONARY ANGIOGRAPHY N/A 05/21/2019   Procedure: LEFT HEART CATH AND CORONARY ANGIOGRAPHY;  Surgeon: Jettie Booze, MD;  Location: [facility];  Service: Cardiovascular;  Laterality: N/A;  . SHOULDER SURGERY       Current Outpatient Medications  Medication Sig Dispense Refill  . allopurinol (ZYLOPRIM) 300 MG tablet Take 300 mg by mouth daily.     Marland Kitchen aspirin 81 MG  chewable tablet Chew 1 tablet (81 mg total) by mouth daily. 90 tablet 1  . atorvastatin (LIPITOR) 80 MG tablet Take 1 tablet (80 mg total) by mouth daily at 6 PM. 90 tablet 1  . clopidogrel (PLAVIX) 75 MG tablet Take 75 mg by mouth daily.    Marland Kitchen lisinopril (ZESTRIL) 2.5 MG tablet Take 1 tablet (2.5 mg total) by mouth daily. 90 tablet 1  . metoprolol tartrate (LOPRESSOR) 25 MG tablet Take 1 tablet (25 mg total) by mouth 2 (two) times daily. 180 tablet 1  . MITIGARE 0.6 MG CAPS Take 0.6 mg by mouth as needed (gout).     . nitroGLYCERIN (NITROSTAT) 0.4 MG SL tablet Place 1 tablet (0.4 mg total) under the tongue every 5 (five) minutes as needed. 25 tablet 2   No current facility-administered medications for this visit.    Allergies:   Morphine and related    Social History:  The patient  reports that he has been smoking cigarettes. He has been smoking about 1.00 pack per day. His smokeless tobacco use includes snuff. He reports that he does not drink alcohol or use drugs.   Family History:  The patient's family history includes Diabetes in his mother.    ROS:  General:no colds or fevers, no weight changes Skin:no rashes or ulcers HEENT:no blurred vision, no congestion CV:see HPI PUL:see HPI GI:no diarrhea constipation or melena, no indigestion GU:no hematuria, no dysuria MS:no joint pain, no claudication, sharp shooting pain in foot on Lt at night, possible neuropathy Neuro:no syncope, no lightheadedness Endo:borderline diabetes, no thyroid disease  Wt Readings from Last 3 Encounters:  08/21/19 274 lb (124.3 kg)  07/04/19 274 lb 6.4 oz (124.5 kg)  06/20/19 270 lb 6.4 oz (122.7 kg)     PHYSICAL EXAM: VS:  BP 118/74   Pulse 72   Ht 5\' 9"  (1.753 m)   Wt 274 lb (124.3 kg)   SpO2 97%   BMI 40.46 kg/m  , BMI Body mass index is 40.46 kg/m. General:Pleasant affect, NAD Skin:Warm and dry, brisk capillary refill HEENT:normocephalic, sclera clear, mucus membranes moist Neck:supple, no  JVD, no bruits  Heart:S1S2 RRR without murmur, gallup, rub or click Lungs:clear without rales, rhonchi, or wheezes ZOX:WRUE, non tender, + BS, do not palpate liver spleen or masses Ext:no lower ext edema, 2+ pedal pulses, 2+ radial pulses, no redness or swelling of foot.  Neuro:alert and oriented X 3, MAE, follows commands, + facial symmetry    EKG:  EKG is NOT ordered today.    Recent Labs: 05/22/2019: Hemoglobin 15.1; Platelets 272 06/05/2019: NT-Pro BNP 189 07/04/2019: ALT 32; BUN 11; Creatinine, Ser 1.13; Potassium 4.5; Sodium 138    Lipid Panel    Component Value Date/Time   CHOL 132 07/04/2019 1216   TRIG 152 (H) 07/04/2019 1216   HDL 35 (L) 07/04/2019 1216   CHOLHDL 3.8 07/04/2019 1216  CHOLHDL 6.3 05/21/2019 2205   VLDL 62 (H) 05/21/2019 2205   LDLCALC 71 07/04/2019 1216       Other studies Reviewed: Additional studies/ records that were reviewed today include: . Cath: 05/21/19   Mid LAD lesion is 50% stenosed.  Mid RCA lesion is 25% stenosed.  2nd Mrg lesion is 99% stenosed.  A drug-eluting stent was successfully placed using a STENT RESOLUTE ONYX 2.0X22.  Post intervention, there is a 0% residual stenosis.  The left ventricular systolic function is normal.  LV end diastolic pressure is normal.  The left ventricular ejection fraction is 50-55% by visual estimate.  There is no aortic valve stenosis.  Short aortic arch making JL3.5 difficult to use. EBU 3 Guide catheter was used to engage left main/LAD. EBU 3.5 may be a better choice if PCI was needed.  Continue aggressive secondary prevention along with DAPT.   We spoke about stopping smoking and other lifestyle modifications.   I informed his aunt of the findings.   Diagnostic Dominance: Right  Intervention     Echo 05/22/19 IMPRESSIONS   1. Left ventricular ejection fraction, by visual estimation, is 50 to 55%. The left ventricle has normal function. There is no left  ventricular hypertrophy. 2. Mild hypokinesis of the left ventricular, basal-mid inferolateral wall. 3. Left ventricular diastolic parameters are consistent with Grade I diastolic dysfunction (impaired relaxation). 4. The left ventricle demonstrates regional wall motion abnormalities. 5. Global right ventricle has normal systolic function.The right ventricular size is normal. No increase in right ventricular wall thickness. 6. Left atrial size was normal. 7. Right atrial size was normal. 8. Presence of pericardial fat pad. 9. Trivial pericardial effusion is present. 10. The mitral valve is grossly normal. Trivial mitral valve regurgitation. 11. The tricuspid valve is grossly normal. Tricuspid valve regurgitation is trivial. 12. The aortic valve is tricuspid. Aortic valve regurgitation is not visualized. 13. The pulmonic valve was grossly normal. Pulmonic valve regurgitation is not visualized. 14. TR signal is inadequate for assessing pulmonary artery systolic pressure. 15. The inferior vena cava is normal in size with greater than 50% respiratory variability, suggesting right atrial pressure of 3 mmHg. 16. No prior Echocardiogram.  FINDINGS Left Ventricle: Left ventricular ejection fraction, by visual estimation, is 50 to 55%. The left ventricle has normal function. Mild hypokinesis of the left ventricular, basal-mid inferolateral wall. The left ventricle demonstrates regional wall motion  abnormalities. The left ventricular internal cavity size was the left ventricle is normal in size. There is no left ventricular hypertrophy. Left ventricular diastolic parameters are consistent with Grade I diastolic dysfunction (impaired relaxation).  Normal left atrial pressure.  Right Ventricle: The right ventricular size is normal. No increase in right ventricular wall thickness. Global RV systolic function is has normal systolic function.  Left Atrium: Left atrial size was normal in  size.  Right Atrium: Right atrial size was normal in size  Pericardium: Trivial pericardial effusion is present. Presence of pericardial fat pad.  Mitral Valve: The mitral valve is grossly normal. Trivial mitral valve regurgitation.  Tricuspid Valve: The tricuspid valve is grossly normal. Tricuspid valve regurgitation is trivial.  Aortic Valve: The aortic valve is tricuspid. Aortic valve regurgitation is not visualized. There is mild calcification of the aortic valve.  Pulmonic Valve: The pulmonic valve was grossly normal. Pulmonic valve regurgitation is not visualized. Pulmonic regurgitation is not visualized.  Aorta: The aortic root is normal in size and structure.  Venous: The inferior vena cava is normal in size  with greater than 50% respiratory variability, suggesting right atrial pressure of 3 mmHg.  IAS/Shunts: No atrial level shunt detected by color flow Doppler.    ASSESSMENT AND PLAN:  1.  CAD with STEMI 05/20/20 and DES to 2nd OM of LCX.  Now on plavix and ASA, Brilinta he continued for 1 month then changed to plavix with resolution of SOB.  His CXR without edema and BNP normal.   On BB and statin, ACE  2.  HLD on statin continue and LDL is improved on lipitor 80, reviewed in office with pt.    3.  Tobacco use stopped smoking   4.   Lt foot pain, not gout, strong pedal pulses, he will discuss with PCP possible neuropathy     Current medicines are reviewed with the patient today.  The patient Has no concerns regarding medicines.  The following changes have been made:  See above Labs/ tests ordered today include:see above  Disposition:   FU:  see above  Signed, Nada Boozer, NP  08/21/2019 10:12 AM    [facility] [address] [place], [place], [place]  [address]/ [address] [place], [place] Phone: [phone]; Fax: [phone]  [phone]

## 2019-08-21 ENCOUNTER — Ambulatory Visit (INDEPENDENT_AMBULATORY_CARE_PROVIDER_SITE_OTHER): Payer: BC Managed Care – PPO | Admitting: Cardiology

## 2019-08-21 ENCOUNTER — Other Ambulatory Visit: Payer: Self-pay

## 2019-08-21 ENCOUNTER — Encounter: Payer: Self-pay | Admitting: Cardiology

## 2019-08-21 VITALS — BP 118/74 | HR 72 | Ht 69.0 in | Wt 274.0 lb

## 2019-08-21 DIAGNOSIS — I2510 Atherosclerotic heart disease of native coronary artery without angina pectoris: Secondary | ICD-10-CM | POA: Diagnosis not present

## 2019-08-21 DIAGNOSIS — Z720 Tobacco use: Secondary | ICD-10-CM

## 2019-08-21 DIAGNOSIS — E782 Mixed hyperlipidemia: Secondary | ICD-10-CM

## 2019-08-21 DIAGNOSIS — M79672 Pain in left foot: Secondary | ICD-10-CM | POA: Diagnosis not present

## 2019-08-21 MED ORDER — FAMOTIDINE 20 MG PO TABS: 20.0000 mg | ORAL_TABLET | Freq: Every day | ORAL | 3 refills | Status: DC

## 2019-08-21 NOTE — Patient Instructions (Addendum)
Medication Instructions:  Your physician recommends that you continue on your current medications as directed. Please refer to the Current Medication list given to you today.  *If you need a refill on your cardiac medications before your next appointment, please call your pharmacy*   Lab Work: None ordered   If you have labs (blood work) drawn today and your tests are completely normal, you will receive your results only by: Marland Kitchen MyChart Message (if you have MyChart) OR . A paper copy in the mail If you have any lab test that is abnormal or we need to change your treatment, we will call you to review the results.   Testing/Procedures: None ordered     Follow-Up: At [facility], you and your health needs are our priority.  As part of our continuing mission to provide you with exceptional heart care, we have created designated Provider Care Teams.  These Care Teams include your primary Cardiologist (physician) and Advanced Practice Providers (APPs -  Physician Assistants and Nurse Practitioners) who all work together to provide you with the care you need, when you need it.  Your next appointment:   5-6 month(s)  The format for your next appointment:   In Person  Provider:   You may see Lance Muss, MD or one of the following Advanced Practice Providers on your designated Care Team:    Norma Fredrickson, NP  Nada Boozer, NP  Georgie Chard, NP    Other Instructions  We recommend signing up for the patient portal called "MyChart".  Sign up information is provided on this After Visit Summary.  MyChart is used to connect with patients for Virtual Visits (Telemedicine).  Patients are able to view lab/test results, encounter notes, upcoming appointments, etc.  Non-urgent messages can be sent to your provider as well.   To learn more about what you can do with MyChart, go to [email or web address].

## 2019-10-04 ENCOUNTER — Telehealth: Payer: Self-pay | Admitting: Interventional Cardiology

## 2019-10-04 DIAGNOSIS — K625 Hemorrhage of anus and rectum: Secondary | ICD-10-CM | POA: Diagnosis not present

## 2019-10-04 DIAGNOSIS — K921 Melena: Secondary | ICD-10-CM | POA: Diagnosis not present

## 2019-10-04 NOTE — Telephone Encounter (Signed)
Pt c/o medication issue:  1. Name of Medication: clopidogrel (PLAVIX) 75 MG tablet  2. How are you currently taking this medication (dosage and times per day)? 1 tablet (80 mg total) by mouth daily at 6 PM  3. Are you having a reaction (difficulty breathing--STAT)? Yes 4. What is your medication issue? Patient states the medication is causing him to pass dark red blood in his urine. Urine has a foul odor, however it is not painful. He states he has not experienced any other symptoms. Please advise.

## 2019-10-04 NOTE — Telephone Encounter (Signed)
Stop aspirin. COntinue Plavix.  We need to check CBC, ideally today.  JV

## 2019-10-04 NOTE — Telephone Encounter (Signed)
I spoke to the patient who has been taking Plavix and Aspirin the past 2 months and had been doing well.  This morning he had a bowel movement and the toilet was filled with "dark, red, chunks of blood and had a very foul odor".    I am having him reach out to his PCP and told him to hold this evenings Plavix until further advisement.  He denies blood in urine as reported by scheduler taking the call.

## 2019-10-04 NOTE — Telephone Encounter (Signed)
Called and spoke to the patient. He states that he saw his PCP today and they drew his labs. He states that they are going to get him set up with a GI MD to arrange for a colonoscopy. I have instructed the patient to stop ASA and continue taking his plavix. I have asked him to have his PCP forward [place] the results of his blood work. Patient verbalized understanding and thanked me for the call.  Attempted to reach out to patient's PCP but their office is now closed.

## 2019-10-07 ENCOUNTER — Telehealth: Payer: Self-pay | Admitting: *Deleted

## 2019-10-07 ENCOUNTER — Other Ambulatory Visit: Payer: Self-pay | Admitting: Gastroenterology

## 2019-10-07 DIAGNOSIS — I2510 Atherosclerotic heart disease of native coronary artery without angina pectoris: Secondary | ICD-10-CM | POA: Diagnosis not present

## 2019-10-07 DIAGNOSIS — K625 Hemorrhage of anus and rectum: Secondary | ICD-10-CM | POA: Diagnosis not present

## 2019-10-07 DIAGNOSIS — M109 Gout, unspecified: Secondary | ICD-10-CM | POA: Diagnosis not present

## 2019-10-07 NOTE — Telephone Encounter (Signed)
   [facility] Pre-operative Risk Assessment    Request for surgical clearance:  1. What type of surgery is being performed? COLONOSCOPY   2. When is this surgery scheduled? 10/25/19   3. What type of clearance is required (medical clearance vs. Pharmacy clearance to hold med vs. Both)? MEDICAL  4. Are there any medications that need to be held prior to surgery and how long? PER DR. HUNG PT WILL STAY ON PLAVIX   5. Practice name and name of physician performing surgery? [facility]; DR. HUNG   6. What is your office phone number [phone]    7.   What is your office fax number [phone]  8.   Anesthesia type (None, local, MAC, general) ? PROPOFOL   Julaine Hua 10/07/2019, 3:28 PM  _________________________________________________________________   (provider comments below)

## 2019-10-08 NOTE — Telephone Encounter (Signed)
   Primary Cardiologist: Lance Muss, MD  Chart reviewed as part of pre-operative protocol coverage.  He was doing well when last seen by Nada Boozer 08/21/19. Given past medical history and time since last visit, based on ACC/AHA guidelines, [employer]. would be at acceptable risk for the planned procedure without further cardiovascular testing.   I will route this recommendation to the requesting party via Epic fax function and remove from pre-op pool.  Please call with questions.  [place], [place] 10/08/2019, 10:17 AM

## 2019-10-22 ENCOUNTER — Other Ambulatory Visit (HOSPITAL_COMMUNITY): Payer: BC Managed Care – PPO | Attending: Gastroenterology

## 2019-10-22 NOTE — Progress Notes (Signed)
Patient called back and clarified what meds to take am of procedure. Patient voiced understanding.

## 2019-10-22 NOTE — Progress Notes (Deleted)
LVMM for Tresa Endo at Dr Dion Saucier office since Cordelia Pen is on vacation and requested lab orders be faxed to [facility] since they can do labwork ast facility prior to surgery.  Fax number [email or web address] for [facility].

## 2019-10-25 ENCOUNTER — Ambulatory Visit (HOSPITAL_COMMUNITY)
Admission: RE | Admit: 2019-10-25 | Payer: BC Managed Care – PPO | Source: Home / Self Care | Admitting: Gastroenterology

## 2019-10-25 DIAGNOSIS — M9901 Segmental and somatic dysfunction of cervical region: Secondary | ICD-10-CM | POA: Diagnosis not present

## 2019-10-25 DIAGNOSIS — M531 Cervicobrachial syndrome: Secondary | ICD-10-CM | POA: Diagnosis not present

## 2019-10-25 DIAGNOSIS — M5032 Other cervical disc degeneration, mid-cervical region, unspecified level: Secondary | ICD-10-CM | POA: Diagnosis not present

## 2019-10-25 DIAGNOSIS — M5414 Radiculopathy, thoracic region: Secondary | ICD-10-CM | POA: Diagnosis not present

## 2019-10-25 SURGERY — COLONOSCOPY WITH PROPOFOL
Anesthesia: Monitor Anesthesia Care

## 2019-10-30 DIAGNOSIS — M5032 Other cervical disc degeneration, mid-cervical region, unspecified level: Secondary | ICD-10-CM | POA: Diagnosis not present

## 2019-10-30 DIAGNOSIS — M9901 Segmental and somatic dysfunction of cervical region: Secondary | ICD-10-CM | POA: Diagnosis not present

## 2019-10-30 DIAGNOSIS — M531 Cervicobrachial syndrome: Secondary | ICD-10-CM | POA: Diagnosis not present

## 2019-10-30 DIAGNOSIS — M5414 Radiculopathy, thoracic region: Secondary | ICD-10-CM | POA: Diagnosis not present

## 2019-11-04 DIAGNOSIS — M531 Cervicobrachial syndrome: Secondary | ICD-10-CM | POA: Diagnosis not present

## 2019-11-04 DIAGNOSIS — M9901 Segmental and somatic dysfunction of cervical region: Secondary | ICD-10-CM | POA: Diagnosis not present

## 2019-11-04 DIAGNOSIS — M5414 Radiculopathy, thoracic region: Secondary | ICD-10-CM | POA: Diagnosis not present

## 2019-11-04 DIAGNOSIS — M5032 Other cervical disc degeneration, mid-cervical region, unspecified level: Secondary | ICD-10-CM | POA: Diagnosis not present

## 2019-11-06 DIAGNOSIS — M25512 Pain in left shoulder: Secondary | ICD-10-CM | POA: Diagnosis not present

## 2019-11-06 DIAGNOSIS — R200 Anesthesia of skin: Secondary | ICD-10-CM | POA: Diagnosis not present

## 2019-11-06 DIAGNOSIS — M549 Dorsalgia, unspecified: Secondary | ICD-10-CM | POA: Diagnosis not present

## 2019-11-21 DIAGNOSIS — R824 Acetonuria: Secondary | ICD-10-CM | POA: Diagnosis not present

## 2019-11-21 DIAGNOSIS — N390 Urinary tract infection, site not specified: Secondary | ICD-10-CM | POA: Diagnosis not present

## 2019-11-21 DIAGNOSIS — M109 Gout, unspecified: Secondary | ICD-10-CM | POA: Diagnosis not present

## 2019-11-21 DIAGNOSIS — R809 Proteinuria, unspecified: Secondary | ICD-10-CM | POA: Diagnosis not present

## 2019-11-21 DIAGNOSIS — R310 Gross hematuria: Secondary | ICD-10-CM | POA: Diagnosis not present

## 2019-11-21 DIAGNOSIS — R822 Biliuria: Secondary | ICD-10-CM | POA: Diagnosis not present

## 2019-11-21 DIAGNOSIS — R82998 Other abnormal findings in urine: Secondary | ICD-10-CM | POA: Diagnosis not present

## 2019-11-25 DIAGNOSIS — R310 Gross hematuria: Secondary | ICD-10-CM | POA: Diagnosis not present

## 2019-12-04 NOTE — Telephone Encounter (Signed)
Labs finally received from PCP.   Hgb/Hct:  10/04/19: 15.4/43.3  11/21/19: 15.8/44.8

## 2019-12-27 DIAGNOSIS — G4733 Obstructive sleep apnea (adult) (pediatric): Secondary | ICD-10-CM | POA: Diagnosis not present

## 2019-12-27 DIAGNOSIS — E119 Type 2 diabetes mellitus without complications: Secondary | ICD-10-CM | POA: Diagnosis not present

## 2019-12-27 DIAGNOSIS — R5383 Other fatigue: Secondary | ICD-10-CM | POA: Diagnosis not present

## 2020-01-02 ENCOUNTER — Other Ambulatory Visit: Payer: Self-pay | Admitting: Cardiology

## 2020-01-02 DIAGNOSIS — I2510 Atherosclerotic heart disease of native coronary artery without angina pectoris: Secondary | ICD-10-CM

## 2020-01-02 DIAGNOSIS — I10 Essential (primary) hypertension: Secondary | ICD-10-CM

## 2020-01-07 ENCOUNTER — Other Ambulatory Visit: Payer: Self-pay | Admitting: Cardiology

## 2020-01-07 DIAGNOSIS — I10 Essential (primary) hypertension: Secondary | ICD-10-CM

## 2020-01-07 DIAGNOSIS — I2510 Atherosclerotic heart disease of native coronary artery without angina pectoris: Secondary | ICD-10-CM

## 2020-01-28 DIAGNOSIS — M109 Gout, unspecified: Secondary | ICD-10-CM | POA: Diagnosis not present

## 2020-01-28 DIAGNOSIS — E119 Type 2 diabetes mellitus without complications: Secondary | ICD-10-CM | POA: Diagnosis not present

## 2020-01-28 DIAGNOSIS — E7800 Pure hypercholesterolemia, unspecified: Secondary | ICD-10-CM | POA: Diagnosis not present

## 2020-01-28 DIAGNOSIS — Z955 Presence of coronary angioplasty implant and graft: Secondary | ICD-10-CM | POA: Diagnosis not present

## 2020-03-24 DIAGNOSIS — M109 Gout, unspecified: Secondary | ICD-10-CM | POA: Diagnosis not present

## 2020-03-24 DIAGNOSIS — E119 Type 2 diabetes mellitus without complications: Secondary | ICD-10-CM | POA: Diagnosis not present

## 2020-03-24 DIAGNOSIS — E7800 Pure hypercholesterolemia, unspecified: Secondary | ICD-10-CM | POA: Diagnosis not present

## 2020-03-30 ENCOUNTER — Other Ambulatory Visit: Payer: Self-pay | Admitting: Cardiology

## 2020-03-31 ENCOUNTER — Other Ambulatory Visit: Payer: Self-pay | Admitting: Cardiology

## 2020-03-31 DIAGNOSIS — I10 Essential (primary) hypertension: Secondary | ICD-10-CM

## 2020-03-31 DIAGNOSIS — I2510 Atherosclerotic heart disease of native coronary artery without angina pectoris: Secondary | ICD-10-CM

## 2020-04-01 DIAGNOSIS — I252 Old myocardial infarction: Secondary | ICD-10-CM | POA: Diagnosis not present

## 2020-04-01 DIAGNOSIS — Z955 Presence of coronary angioplasty implant and graft: Secondary | ICD-10-CM | POA: Diagnosis not present

## 2020-04-01 DIAGNOSIS — Z0000 Encounter for general adult medical examination without abnormal findings: Secondary | ICD-10-CM | POA: Diagnosis not present

## 2020-04-01 DIAGNOSIS — E1159 Type 2 diabetes mellitus with other circulatory complications: Secondary | ICD-10-CM | POA: Diagnosis not present

## 2020-04-01 DIAGNOSIS — Z23 Encounter for immunization: Secondary | ICD-10-CM | POA: Diagnosis not present

## 2020-06-03 ENCOUNTER — Emergency Department (HOSPITAL_COMMUNITY)
Admission: EM | Admit: 2020-06-03 | Discharge: 2020-06-03 | Disposition: A | Discharge status: Home / Self Care | Payer: BC Managed Care – PPO | Attending: Emergency Medicine | Admitting: Emergency Medicine

## 2020-06-03 ENCOUNTER — Other Ambulatory Visit: Payer: Self-pay

## 2020-06-03 ENCOUNTER — Emergency Department (HOSPITAL_COMMUNITY): Payer: BC Managed Care – PPO

## 2020-06-03 ENCOUNTER — Encounter (HOSPITAL_COMMUNITY): Payer: Self-pay | Admitting: Emergency Medicine

## 2020-06-03 ENCOUNTER — Emergency Department (HOSPITAL_COMMUNITY)
Admission: EM | Admit: 2020-06-03 | Discharge: 2020-06-03 | Disposition: A | Discharge status: Home / Self Care | Payer: BC Managed Care – PPO | Source: Home / Self Care | Attending: Emergency Medicine | Admitting: Emergency Medicine

## 2020-06-03 ENCOUNTER — Encounter (HOSPITAL_COMMUNITY): Payer: Self-pay

## 2020-06-03 DIAGNOSIS — R1011 Right upper quadrant pain: Secondary | ICD-10-CM | POA: Insufficient documentation

## 2020-06-03 DIAGNOSIS — I10 Essential (primary) hypertension: Secondary | ICD-10-CM | POA: Insufficient documentation

## 2020-06-03 DIAGNOSIS — F17210 Nicotine dependence, cigarettes, uncomplicated: Secondary | ICD-10-CM | POA: Insufficient documentation

## 2020-06-03 DIAGNOSIS — R110 Nausea: Secondary | ICD-10-CM | POA: Diagnosis not present

## 2020-06-03 DIAGNOSIS — R0789 Other chest pain: Secondary | ICD-10-CM | POA: Insufficient documentation

## 2020-06-03 DIAGNOSIS — M25512 Pain in left shoulder: Secondary | ICD-10-CM | POA: Insufficient documentation

## 2020-06-03 DIAGNOSIS — Z5321 Procedure and treatment not carried out due to patient leaving prior to being seen by health care provider: Secondary | ICD-10-CM | POA: Diagnosis not present

## 2020-06-03 DIAGNOSIS — R079 Chest pain, unspecified: Secondary | ICD-10-CM

## 2020-06-03 DIAGNOSIS — K760 Fatty (change of) liver, not elsewhere classified: Secondary | ICD-10-CM | POA: Diagnosis not present

## 2020-06-03 DIAGNOSIS — I2510 Atherosclerotic heart disease of native coronary artery without angina pectoris: Secondary | ICD-10-CM | POA: Insufficient documentation

## 2020-06-03 DIAGNOSIS — R1013 Epigastric pain: Secondary | ICD-10-CM | POA: Insufficient documentation

## 2020-06-03 DIAGNOSIS — Z79899 Other long term (current) drug therapy: Secondary | ICD-10-CM | POA: Insufficient documentation

## 2020-06-03 DIAGNOSIS — I252 Old myocardial infarction: Secondary | ICD-10-CM | POA: Insufficient documentation

## 2020-06-03 DIAGNOSIS — Z951 Presence of aortocoronary bypass graft: Secondary | ICD-10-CM | POA: Insufficient documentation

## 2020-06-03 LAB — BASIC METABOLIC PANEL
Anion gap: 10 (ref 5–15)
BUN: 8 mg/dL (ref 6–20)
CO2: 26 mmol/L (ref 22–32)
Calcium: 9 mg/dL (ref 8.9–10.3)
Chloride: 99 mmol/L (ref 98–111)
Creatinine, Ser: 1.22 mg/dL (ref 0.61–1.24)
GFR, Estimated: >60 mL/min (ref >60)
Glucose, Bld: 157 mg/dL — ABNORMAL HIGH (ref 70–99)
Potassium: 3.5 mmol/L (ref 3.5–5.1)
Sodium: 135 mmol/L (ref 135–145)

## 2020-06-03 LAB — HEPATIC FUNCTION PANEL
ALT: 22 U/L (ref 0–44)
AST: 18 U/L (ref 15–41)
Albumin: 4.1 g/dL (ref 3.5–5.0)
Alkaline Phosphatase: 101 U/L (ref 38–126)
Bilirubin, Direct: 0.1 mg/dL (ref 0.0–0.2)
Indirect Bilirubin: 0.7 mg/dL (ref 0.3–0.9)
Total Bilirubin: 0.8 mg/dL (ref 0.3–1.2)
Total Protein: 7.5 g/dL (ref 6.5–8.1)

## 2020-06-03 LAB — TROPONIN I (HIGH SENSITIVITY)
Troponin I (High Sensitivity): 3 ng/L (ref <18)
Troponin I (High Sensitivity): 3 ng/L (ref <18)
Troponin I (High Sensitivity): 3 ng/L (ref <18)
Troponin I (High Sensitivity): <2 ng/L (ref <18)

## 2020-06-03 LAB — CBC
HCT: 47.3 % (ref 39.0–52.0)
Hemoglobin: 16.7 g/dL (ref 13.0–17.0)
MCH: 30.6 pg (ref 26.0–34.0)
MCHC: 35.3 g/dL (ref 30.0–36.0)
MCV: 86.8 fL (ref 80.0–100.0)
Platelets: 292 10*3/uL (ref 150–400)
RBC: 5.45 MIL/uL (ref 4.22–5.81)
RDW: 12.3 % (ref 11.5–15.5)
WBC: 11.2 10*3/uL — ABNORMAL HIGH (ref 4.0–10.5)
nRBC: 0 % (ref 0.0–0.2)

## 2020-06-03 LAB — PROTIME-INR
INR: 1 (ref 0.8–1.2)
Prothrombin Time: 12.7 seconds (ref 11.4–15.2)

## 2020-06-03 LAB — LIPASE, BLOOD: Lipase: 54 U/L — ABNORMAL HIGH (ref 11–51)

## 2020-06-03 MED ORDER — ALUM & MAG HYDROXIDE-SIMETH 200-200-20 MG/5ML PO SUSP
30.0000 mL | Freq: Once | ORAL | Status: AC
  Administered 2020-06-03: 30 mL via ORAL
  Filled 2020-06-03: qty 30

## 2020-06-03 MED ORDER — PANTOPRAZOLE SODIUM 40 MG IV SOLR
40.0000 mg | Freq: Once | INTRAVENOUS | Status: AC
  Administered 2020-06-03: 40 mg via INTRAVENOUS
  Filled 2020-06-03: qty 40

## 2020-06-03 NOTE — ED Notes (Signed)
Pt walked out of the door and got into car and left.

## 2020-06-03 NOTE — ED Provider Notes (Signed)
[facility]-EMERGENCY DEPT Provider Note   CSN: [id number] Arrival date & time: 06/03/20  1213     History No chief complaint on file.   Timothy [employer]. is a 49 y.o. male.  HPI Patient does have history of heart attack.  He reports yesterday he ate at [employer] in the morning.  He felt fine.  He reports by early to mid afternoon he was getting some burning in his chest.  He attributed that to heartburn from his meal.  This symptom continued to persist and wax and wane.  He then started to develop some discomfort into his left shoulder and arm.  He reports that he tried baking soda and water, Tums, Gas-X and Pepcid.  He kept thinking if he could just passed some gas the symptom would improve.  He did not get any relief with these measures.  When the symptoms persisted today with associated left arm discomfort he determined he should get evaluation emergency department.  He did go at 4 AM to [facility] emergency department.  Due to the long wait time he was not evaluated by a provider.  He did have a troponin without elevation and EKG done without STEMI.  He then came to [place] [facility] for further evaluation.  He reports this pain feels significantly different than when he had a heart attack.    Past Medical History:  Diagnosis Date  . Acute MI, lateral wall (HCC) 05/21/2019  . Coronary artery disease   . Hyperlipidemia   . Hypertension   . S/P angioplasty with stent to OM2  DES 05/21/2019  . STEMI (ST elevation myocardial infarction) (HCC) 05/21/2019  . Tobacco use     Patient Active Problem List   Diagnosis Date Noted  . Hyperlipidemia 05/23/2019  . Hypertension 05/23/2019  . Acute MI, lateral wall (HCC) 05/21/2019  . Acute MI, inferolateral wall [facility])     Past Surgical History:  Procedure Laterality Date  . CORONARY/GRAFT ACUTE MI REVASCULARIZATION N/A 05/21/2019   Procedure: Coronary/Graft Acute MI Revascularization;  Surgeon: Corky Crafts, MD;   Location: [facility] INVASIVE CV LAB;  Service: Cardiovascular;  Laterality: N/A;  . KNEE ARTHROSCOPY    . LEFT HEART CATH AND CORONARY ANGIOGRAPHY N/A 05/21/2019   Procedure: LEFT HEART CATH AND CORONARY ANGIOGRAPHY;  Surgeon: Corky Crafts, MD;  Location: [facility] INVASIVE CV LAB;  Service: Cardiovascular;  Laterality: N/A;  . SHOULDER SURGERY         Family History  Problem Relation Age of Onset  . Diabetes Mother     Social History   Tobacco Use  . Smoking status: Current Every Day Smoker    Packs/day: 1.00    Types: Cigarettes  . Smokeless tobacco: Current User    Types: Snuff  Vaping Use  . Vaping Use: Never used  Substance Use Topics  . Alcohol use: No  . Drug use: No    Home Medications Prior to Admission medications   Medication Sig Start Date End Date Taking? Authorizing Provider  allopurinol (ZYLOPRIM) 300 MG tablet Take 300 mg by mouth daily.  07/06/18   [provider]  atorvastatin (LIPITOR) 80 MG tablet TAKE 1 TABLET BY MOUTH DAILY AT 6 PM 03/31/20   Corky Crafts, MD  clopidogrel (PLAVIX) 75 MG tablet Take 75 mg by mouth daily.    [provider]  famotidine (PEPCID) 20 MG tablet TAKE 1 TABLET BY MOUTH EVERY DAY 03/31/20   Leone Brand, NP  lisinopril (  ZESTRIL) 2.5 MG tablet TAKE 1 TABLET BY MOUTH EVERY DAY 01/02/20   Laverda Page B, NP  metoprolol tartrate (LOPRESSOR) 25 MG tablet TAKE 1 TABLET BY MOUTH TWICE A DAY 01/08/20   Laverda Page B, NP  MITIGARE 0.6 MG CAPS Take 0.6 mg by mouth daily as needed (gout).  07/06/18   [provider]  nitroGLYCERIN (NITROSTAT) 0.4 MG SL tablet Place 1 tablet (0.4 mg total) under the tongue every 5 (five) minutes as needed. 05/23/19   Arty Baumgartner, NP  sodium chloride (OCEAN) 0.65 % SOLN nasal spray Place 1 spray into both nostrils as needed for congestion.    [provider]    Allergies    Morphine and related  Review of Systems   Review of Systems 10 systems  reviewed and negative except as per HPI Physical Exam Updated Vital Signs BP (!) 153/88 (BP Location: Right Arm)   Pulse 83   Temp 98 F (36.7 C) (Oral)   Resp 16   SpO2 100%   Physical Exam Constitutional:      Appearance: He is well-developed and well-nourished.  HENT:     Head: Normocephalic and atraumatic.     Mouth/Throat:     Pharynx: Oropharynx is clear.  Eyes:     Extraocular Movements: EOM normal.     Pupils: Pupils are equal, round, and reactive to light.  Cardiovascular:     Rate and Rhythm: Normal rate and regular rhythm.     Pulses: Intact distal pulses.     Heart sounds: Normal heart sounds.  Pulmonary:     Effort: Pulmonary effort is normal.     Breath sounds: Normal breath sounds.  Abdominal:     General: Bowel sounds are normal. There is no distension.     Palpations: Abdomen is soft.     Tenderness: There is abdominal tenderness.     Comments: Patient has discomfort to palpation in the epigastrium and right upper quadrant.  No guarding.  Remainder of abdomen nontender.  Musculoskeletal:        General: No tenderness or edema. Normal range of motion.     Cervical back: Neck supple.     Right lower leg: No edema.     Left lower leg: No edema.  Skin:    General: Skin is warm, dry and intact.  Neurological:     Mental Status: He is alert and oriented to person, place, and time.     GCS: GCS eye subscore is 4. GCS verbal subscore is 5. GCS motor subscore is 6.     Coordination: Coordination normal.     Deep Tendon Reflexes: Strength normal.  Psychiatric:        Mood and Affect: Mood and affect normal.     ED Results / Procedures / Treatments   Labs (all labs ordered are listed, but only abnormal results are displayed) Labs Reviewed  HEPATIC FUNCTION PANEL  LIPASE, BLOOD  TROPONIN I (HIGH SENSITIVITY)    EKG EKG Interpretation  Date/Time:  Wednesday June 03 2020 12:38:39 EST Ventricular Rate:  101 PR Interval:  152 QRS Duration: 72 QT  Interval:  336 QTC Calculation: 435 R Axis:   18 Text Interpretation: Sinus tachycardia Septal infarct , age undetermined Abnormal ECG no acute ischemic changes, no sig change from old Confirmed by Timothy [name] [phone]) on 06/03/2020 3:47:10 PM   Radiology DG Chest 2 View  Result Date: 06/03/2020 CLINICAL DATA:  Chest pain EXAM: CHEST - 2 VIEW COMPARISON:  None. FINDINGS: The heart size and mediastinal contours are within normal limits. Low lung volumes without consolidation or edema. The visualized skeletal structures are unremarkable. IMPRESSION: No acute process in the chest. Electronically Signed   By: Guadlupe Spanish M.D.   On: 06/03/2020 13:40    Procedures Procedures (including critical care time)  Medications Ordered in ED Medications  pantoprazole (PROTONIX) injection 40 mg (40 mg Intravenous Given 06/03/20 1650)  alum & mag hydroxide-simeth (MAALOX/MYLANTA) 200-200-20 MG/5ML suspension 30 mL (30 mLs Oral Given 06/03/20 1631)    ED Course  I have reviewed the triage vital signs and the nursing notes.  Pertinent labs & imaging results that were available during my care of the patient were reviewed by me and considered in my medical decision making (see chart for details).    MDM Rules/Calculators/A&P                          Patient has had chest pain and epigastric pain since yesterday afternoon.  First troponin done at [facility] facility at about 4 AM was negative.  Will repeat troponins.  EKGs do not show acute ischemic changes.  On examination, patient also has discomfort to palpation in the epigastrium and right upper quadrant.  Will administer Protonix and GI cocktail, also obtain ultrasound gallbladder.  Patient is significant risk factors include prior MI.  Will get serial troponins  Dr.Dykstra to assume care for final disposition Final Clinical Impression(s) / ED Diagnoses Final diagnoses:  Nonspecific chest pain  Right upper quadrant pain    Rx / DC  Orders ED Discharge Orders    None       Timothy Barrette, MD 06/03/20 1715

## 2020-06-03 NOTE — ED Triage Notes (Signed)
Chest pain that began around 2pm on 06/02/20, left arm numbness, feels like he has gas trapped in his stomach.

## 2020-06-03 NOTE — Discharge Instructions (Addendum)
Follow-up with your primary doctor and cardiology.  Return to ER if you develop recurrent or worsening pain, or other new concerning symptom.

## 2020-06-03 NOTE — ED Triage Notes (Signed)
Pt here for eval of gradual CP that began around 2pm yesterday. Pt endorses radiation into L arm & foul taste in mouth that is making him nauseous. Describes the pain as tightness. Hx MI 2020, stent placement, states he is borderline DM & HTN. Compliant w home medications. Advises that he took at home dose of NTG with no relief.  EKG unremarkable, VSS

## 2020-06-03 NOTE — ED Provider Notes (Signed)
Signout note  49 year old male with history of CAD presenting to ER with concern for chest burning sensation.  EKG without acute ischemic change, initial troponin negative.  On exam noted some mild epigastric tenderness.  Right upper quadrant ultrasound and LFTs and lipase ordered.  Received signout from Dr. Clarice Pole  Reviewed results, updated patient.  Repeat abdominal exam is soft, no tenderness appreciated.  Patient has no ongoing symptoms.  Troponin within normal limits.  LFTs within normal limits.  Lipase near normal.  Will discharge home with plan for outpatient follow-up with primary and cardiology   Milagros Loll, MD 06/04/20 [phone]

## 2020-06-16 ENCOUNTER — Other Ambulatory Visit: Payer: Self-pay | Admitting: Cardiology

## 2020-07-06 ENCOUNTER — Other Ambulatory Visit: Payer: Self-pay | Admitting: Cardiology

## 2020-07-06 ENCOUNTER — Other Ambulatory Visit: Payer: Self-pay | Admitting: Interventional Cardiology

## 2020-07-06 DIAGNOSIS — I2510 Atherosclerotic heart disease of native coronary artery without angina pectoris: Secondary | ICD-10-CM

## 2020-07-06 DIAGNOSIS — I10 Essential (primary) hypertension: Secondary | ICD-10-CM

## 2020-08-05 DIAGNOSIS — I10 Essential (primary) hypertension: Secondary | ICD-10-CM | POA: Diagnosis not present

## 2020-08-05 DIAGNOSIS — E7800 Pure hypercholesterolemia, unspecified: Secondary | ICD-10-CM | POA: Diagnosis not present

## 2020-08-05 DIAGNOSIS — Z955 Presence of coronary angioplasty implant and graft: Secondary | ICD-10-CM | POA: Diagnosis not present

## 2020-08-05 DIAGNOSIS — Z125 Encounter for screening for malignant neoplasm of prostate: Secondary | ICD-10-CM | POA: Diagnosis not present

## 2020-08-05 DIAGNOSIS — E1159 Type 2 diabetes mellitus with other circulatory complications: Secondary | ICD-10-CM | POA: Diagnosis not present

## 2020-08-05 DIAGNOSIS — I252 Old myocardial infarction: Secondary | ICD-10-CM | POA: Diagnosis not present

## 2020-10-25 NOTE — Progress Notes (Signed)
Cardiology Office Note   Date:  10/26/2020   ID:  Timothy Lenz., DOB [birth year], Timothy [name]  PCP:  Irena Reichmann, DO    No chief complaint on file.  CAD  Wt Readings from Last 3 Encounters:  10/26/20 268 lb 6.4 oz (121.7 kg)  06/03/20 260 lb (117.9 kg)  08/21/19 274 lb (124.3 kg)       History of Present Illness: Timothy Whiteley. is a 50 y.o. male  who presents who has a history of HTN.  In 05/2019,He complained ofacute onset mid chest pressure/gas like indigestion feeling 'in the middle of the day', at rest, lasted an hour or so and went away. Later thatevening, at home, after showering, felt similar pain/discomfort, mid chest, radiating to left arm, with sob. Symptomswereacute onset, moderate, persistent, constant, dull/pressure. Stated symptoms weresimilar milder/briefer pain in past. EKG notedwith inferolateral STEMI.  Cath showed:  "Mid LAD lesion is 50% stenosed.  Mid RCA lesion is 25% stenosed.  2nd Mrg lesion is 99% stenosed.  A drug-eluting stent was successfully placed using a STENT RESOLUTE ONYX 2.0X22.  Post intervention, there is a 0% residual stenosis.  The left ventricular systolic function is normal.  LV end diastolic pressure is normal.  The left ventricular ejection fraction is 50-55% by visual estimate.  There is no aortic valve stenosis.  Short aortic arch making JL3.5 difficult to use. EBU 3 Guide catheter was used to engage left main/LAD. EBU 3.5 may be a better choice if PCI was needed.   Continue aggressive secondary prevention along with DAPT.    We spoke about stopping smoking and other lifestyle modifications."  Follow up echo showedLVEF=50-55%, mild hypokinesis of the basal mid inferolateral wall. He was able to tolerate the addition of metoprolol 25mg  BID and lisinopril 2.5mg  daily. Worked well with cardiac rehab without recurrent chest pain.   Denies : Chest pain. Dizziness. Leg edema. Nitroglycerin use. Orthopnea.  Palpitations. Paroxysmal nocturnal dyspnea. Shortness of breath. Syncope.   Notes some rectal bleeding.  Showed me a picture of a recent BM.   Walks some, but not to target level noted below.    Past Medical History:  Diagnosis Date  . Acute MI, lateral wall (HCC) 05/21/2019  . Coronary artery disease   . Hyperlipidemia   . Hypertension   . S/P angioplasty with stent to OM2  DES 05/21/2019  . STEMI (ST elevation myocardial infarction) (HCC) 05/21/2019  . Tobacco use     Past Surgical History:  Procedure Laterality Date  . CORONARY/GRAFT ACUTE MI REVASCULARIZATION N/A 05/21/2019   Procedure: Coronary/Graft Acute MI Revascularization;  Surgeon: 14/01/2019, MD;  Location: [facility] INVASIVE CV LAB;  Service: Cardiovascular;  Laterality: N/A;  . KNEE ARTHROSCOPY    . LEFT HEART CATH AND CORONARY ANGIOGRAPHY N/A 05/21/2019   Procedure: LEFT HEART CATH AND CORONARY ANGIOGRAPHY;  Surgeon: 14/01/2019, MD;  Location: [facility] INVASIVE CV LAB;  Service: Cardiovascular;  Laterality: N/A;  . SHOULDER SURGERY       Current Outpatient Medications  Medication Sig Dispense Refill  . acetaminophen (TYLENOL) 325 MG tablet Take 650 mg by mouth every 6 (six) hours as needed for mild pain, fever or headache.    . allopurinol (ZYLOPRIM) 300 MG tablet Take 300 mg by mouth daily.     [facility] aspirin EC 81 MG tablet Take 81 mg by mouth daily. Swallow whole.    Marland Kitchen atorvastatin (LIPITOR) 80 MG tablet Take 1 tablet (80 mg total) by  mouth daily at 6 PM. Please make yearly appt with Dr. Eldridge Dace for March 2022 for future refills. Thank you 1st attempt 90 tablet 0  . calcium carbonate (TUMS - DOSED IN MG ELEMENTAL CALCIUM) 500 MG chewable tablet Chew 1,000-1,500 tablets by mouth 3 (three) times daily as needed for indigestion or heartburn.    . clopidogrel (PLAVIX) 75 MG tablet TAKE 4 TABLETS AS YOUR FIRST DOSE, THEN TAKE 1 TABLET BY MOUTH ONCE A DAY. 90 tablet 4  . famotidine (PEPCID) 20 MG tablet TAKE 1 TABLET  BY MOUTH EVERY DAY 90 tablet 1  . lisinopril (ZESTRIL) 2.5 MG tablet TAKE 1 TABLET BY MOUTH EVERY DAY (Patient taking differently: Take 2.5 mg by mouth daily.) 90 tablet 2  . metoprolol tartrate (LOPRESSOR) 25 MG tablet TAKE 1 TABLET BY MOUTH TWICE A DAY (Patient taking differently: Take 25 mg by mouth 2 (two) times daily.) 180 tablet 2  . nitroGLYCERIN (NITROSTAT) 0.4 MG SL tablet Place 1 tablet (0.4 mg total) under the tongue every 5 (five) minutes as needed. (Patient taking differently: Place 0.4 mg under the tongue every 5 (five) minutes as needed for chest pain.) 25 tablet 2  . OZEMPIC, 0.25 OR 0.5 MG/DOSE, 2 MG/1.5ML SOPN Inject 0.5 mg into the skin once a week.     No current facility-administered medications for this visit.    Allergies:   Morphine and related    Social History:  The patient  reports that he has quit smoking. His smoking use included cigarettes. He smoked 1.00 pack per day. He has quit using smokeless tobacco.  His smokeless tobacco use included snuff. He reports that he does not drink alcohol and does not use drugs.   Family History:  The patient's family history includes Diabetes in his mother.    ROS:  Please see the history of present illness.   Otherwise, review of systems are positive for rectal bleeding.   All other systems are reviewed and negative.    PHYSICAL EXAM: VS:  BP 120/74   Pulse 89   Ht 5\' 10"  (1.778 m)   Wt 268 lb 6.4 oz (121.7 kg)   SpO2 94%   BMI 38.51 kg/m  , BMI Body mass index is 38.51 kg/m. GEN: Well nourished, well developed, in no acute distress  HEENT: normal  Neck: no JVD, carotid bruits, or masses Cardiac: RRR; no murmurs, rubs, or gallops,no edema  Respiratory:  clear to auscultation bilaterally, normal work of breathing GI: soft, nontender, nondistended, + BS MS: no deformity or atrophy  Skin: warm and dry, no rash Neuro:  Strength and sensation are intact Psych: euthymic mood, full affect   EKG:   The ekg ordered  12/21 demonstrates sinus tach, no ST changes   Recent Labs: 06/03/2020: ALT 22; BUN 8; Creatinine, Ser 1.22; Hemoglobin 16.7; Platelets 292; Potassium 3.5; Sodium 135   Lipid Panel    Component Value Date/Time   CHOL 132 07/04/2019 1216   TRIG 152 (H) 07/04/2019 1216   HDL 35 (L) 07/04/2019 1216   CHOLHDL 3.8 07/04/2019 1216   CHOLHDL 6.3 05/21/2019 2205   VLDL 62 (H) 05/21/2019 2205   LDLCALC 71 07/04/2019 1216     Other studies Reviewed: Additional studies/ records that were reviewed today with results demonstrating: labs reviewed.     ASSESSMENT AND PLAN:  1. CAD/Old MI: No angina.  Continue aggressive secondary prevention. No restrictions on activity.  He was worried about his stent dislodging.  This will not happen.  2. Hyperlipidemia: LDL 76 in 2021.  Continue statin.   3. Tobacco abuse: Quit smoking at the time of MI.   4. Rectal bleeding: stop aspirin.  COntinue plavix.  CBC today.  Hopefully, rectal bleeding stops with stopping aspirin.  If not, he will let us know. 5. DM: A1C  Controlled now on Ozempic per his report. A1C.  High-fiber, whole food, plant-based diet.    Current medicines are reviewed at length with the patient today.  The patient concerns regarding his medicines were addressed.  The following changes have been made:  Stop aspirin  Labs/ tests ordered today include:  No orders of the defined types were placed in this encounter.   Recommend 150 minutes/week of aerobic exercise Low fat, low carb, high fiber diet recommended  Disposition:   FU in 1 year   Signed, Lance Muss, MD  10/26/2020 10:05 AM    [facility] [address] [place], [place], [place]  [address] Phone: [phone]; Fax: [phone]

## 2020-10-26 ENCOUNTER — Ambulatory Visit (INDEPENDENT_AMBULATORY_CARE_PROVIDER_SITE_OTHER): Payer: BC Managed Care – PPO | Admitting: Interventional Cardiology

## 2020-10-26 ENCOUNTER — Encounter (INDEPENDENT_AMBULATORY_CARE_PROVIDER_SITE_OTHER): Payer: Self-pay

## 2020-10-26 ENCOUNTER — Encounter: Payer: Self-pay | Admitting: Interventional Cardiology

## 2020-10-26 ENCOUNTER — Other Ambulatory Visit: Payer: Self-pay

## 2020-10-26 VITALS — BP 120/74 | HR 89 | Ht 70.0 in | Wt 268.4 lb

## 2020-10-26 DIAGNOSIS — I2510 Atherosclerotic heart disease of native coronary artery without angina pectoris: Secondary | ICD-10-CM | POA: Diagnosis not present

## 2020-10-26 DIAGNOSIS — Z720 Tobacco use: Secondary | ICD-10-CM | POA: Diagnosis not present

## 2020-10-26 DIAGNOSIS — K625 Hemorrhage of anus and rectum: Secondary | ICD-10-CM

## 2020-10-26 DIAGNOSIS — I10 Essential (primary) hypertension: Secondary | ICD-10-CM | POA: Diagnosis not present

## 2020-10-26 DIAGNOSIS — E782 Mixed hyperlipidemia: Secondary | ICD-10-CM

## 2020-10-26 NOTE — Patient Instructions (Signed)
Medication Instructions:  Your physician has recommended you make the following change in your medication: Stop aspirin  *If you need a refill on your cardiac medications before your next appointment, please call your pharmacy*   Lab Work: Lab work to be done today--CBC and A1C If you have labs (blood work) drawn today and your tests are completely normal, you will receive your results only by: Marland Kitchen MyChart Message (if you have MyChart) OR . A paper copy in the mail If you have any lab test that is abnormal or we need to change your treatment, we will call you to review the results.   Testing/Procedures: none   Follow-Up: At [facility], you and your health needs are our priority.  As part of our continuing mission to provide you with exceptional heart care, we have created designated Provider Care Teams.  These Care Teams include your primary Cardiologist (physician) and Advanced Practice Providers (APPs -  Physician Assistants and Nurse Practitioners) who all work together to provide you with the care you need, when you need it.  We recommend signing up for the patient portal called "MyChart".  Sign up information is provided on this After Visit Summary.  MyChart is used to connect with patients for Virtual Visits (Telemedicine).  Patients are able to view lab/test results, encounter notes, upcoming appointments, etc.  Non-urgent messages can be sent to your provider as well.   To learn more about what you can do with MyChart, go to [email or web address].    Your next appointment:   12 month(s)  The format for your next appointment:   In Person  Provider:   You may see Lance Muss, MD or one of the following Advanced Practice Providers on your designated Care Team:    Ronie Spies, PA-C  Jacolyn Reedy, PA-C    Other Instructions

## 2020-10-27 LAB — CBC
Hematocrit: 45.8 % (ref 37.5–51.0)
Hemoglobin: 15.6 g/dL (ref 13.0–17.7)
MCH: 29.8 pg (ref 26.6–33.0)
MCHC: 34.1 g/dL (ref 31.5–35.7)
MCV: 87 fL (ref 79–97)
Platelets: 271 10*3/uL (ref 150–450)
RBC: 5.24 x10E6/uL (ref 4.14–5.80)
RDW: 13 % (ref 11.6–15.4)
WBC: 8.7 10*3/uL (ref 3.4–10.8)

## 2020-10-27 LAB — HEMOGLOBIN A1C
Est. average glucose Bld gHb Est-mCnc: 137 mg/dL
Hgb A1c MFr Bld: 6.4 % — ABNORMAL HIGH (ref 4.8–5.6)

## 2020-12-05 ENCOUNTER — Other Ambulatory Visit: Payer: Self-pay | Admitting: Cardiology

## 2020-12-05 DIAGNOSIS — I10 Essential (primary) hypertension: Secondary | ICD-10-CM

## 2020-12-05 DIAGNOSIS — I2510 Atherosclerotic heart disease of native coronary artery without angina pectoris: Secondary | ICD-10-CM

## 2020-12-07 NOTE — Telephone Encounter (Signed)
Rx(s) sent to pharmacy electronically.  

## 2020-12-14 ENCOUNTER — Other Ambulatory Visit: Payer: Self-pay | Admitting: Interventional Cardiology

## 2020-12-14 DIAGNOSIS — I10 Essential (primary) hypertension: Secondary | ICD-10-CM

## 2020-12-14 DIAGNOSIS — I2510 Atherosclerotic heart disease of native coronary artery without angina pectoris: Secondary | ICD-10-CM

## 2021-01-03 ENCOUNTER — Emergency Department (HOSPITAL_COMMUNITY): Payer: BC Managed Care – PPO

## 2021-01-03 ENCOUNTER — Encounter (HOSPITAL_COMMUNITY): Payer: Self-pay

## 2021-01-03 ENCOUNTER — Emergency Department (HOSPITAL_COMMUNITY)
Admission: EM | Admit: 2021-01-03 | Discharge: 2021-01-03 | Disposition: A | Discharge status: Home / Self Care | Payer: BC Managed Care – PPO | Attending: Emergency Medicine | Admitting: Emergency Medicine

## 2021-01-03 DIAGNOSIS — R200 Anesthesia of skin: Secondary | ICD-10-CM | POA: Diagnosis not present

## 2021-01-03 DIAGNOSIS — R079 Chest pain, unspecified: Secondary | ICD-10-CM | POA: Diagnosis not present

## 2021-01-03 DIAGNOSIS — Z87891 Personal history of nicotine dependence: Secondary | ICD-10-CM | POA: Diagnosis not present

## 2021-01-03 DIAGNOSIS — I10 Essential (primary) hypertension: Secondary | ICD-10-CM | POA: Diagnosis not present

## 2021-01-03 DIAGNOSIS — R110 Nausea: Secondary | ICD-10-CM | POA: Diagnosis not present

## 2021-01-03 DIAGNOSIS — R202 Paresthesia of skin: Secondary | ICD-10-CM | POA: Insufficient documentation

## 2021-01-03 DIAGNOSIS — R072 Precordial pain: Secondary | ICD-10-CM | POA: Diagnosis not present

## 2021-01-03 DIAGNOSIS — Z79899 Other long term (current) drug therapy: Secondary | ICD-10-CM | POA: Diagnosis not present

## 2021-01-03 DIAGNOSIS — I2510 Atherosclerotic heart disease of native coronary artery without angina pectoris: Secondary | ICD-10-CM | POA: Diagnosis not present

## 2021-01-03 DIAGNOSIS — Z955 Presence of coronary angioplasty implant and graft: Secondary | ICD-10-CM | POA: Insufficient documentation

## 2021-01-03 LAB — CBC
HCT: 43 % (ref 39.0–52.0)
Hemoglobin: 15.1 g/dL (ref 13.0–17.0)
MCH: 30.4 pg (ref 26.0–34.0)
MCHC: 35.1 g/dL (ref 30.0–36.0)
MCV: 86.5 fL (ref 80.0–100.0)
Platelets: 308 10*3/uL (ref 150–400)
RBC: 4.97 MIL/uL (ref 4.22–5.81)
RDW: 12.2 % (ref 11.5–15.5)
WBC: 10.3 10*3/uL (ref 4.0–10.5)
nRBC: 0 % (ref 0.0–0.2)

## 2021-01-03 LAB — BASIC METABOLIC PANEL
Anion gap: 10 (ref 5–15)
BUN: 12 mg/dL (ref 6–20)
CO2: 25 mmol/L (ref 22–32)
Calcium: 9 mg/dL (ref 8.9–10.3)
Chloride: 105 mmol/L (ref 98–111)
Creatinine, Ser: 1.41 mg/dL — ABNORMAL HIGH (ref 0.61–1.24)
GFR, Estimated: >60 mL/min (ref >60)
Glucose, Bld: 152 mg/dL — ABNORMAL HIGH (ref 70–99)
Potassium: 3.7 mmol/L (ref 3.5–5.1)
Sodium: 140 mmol/L (ref 135–145)

## 2021-01-03 LAB — PROTIME-INR
INR: 1 (ref 0.8–1.2)
Prothrombin Time: 12.8 seconds (ref 11.4–15.2)

## 2021-01-03 LAB — TROPONIN I (HIGH SENSITIVITY)
Troponin I (High Sensitivity): 3 ng/L (ref <18)
Troponin I (High Sensitivity): 3 ng/L (ref <18)

## 2021-01-03 NOTE — ED Provider Notes (Signed)
[facility] EMERGENCY DEPARTMENT Provider Note   CSN: [id number] Arrival date & time: 01/03/21  4098     History Chief Complaint  Patient presents with   Numbness   Palpitations    Timothy [name]. is a 50 y.o. male.  Patient with history of HTN, CAD s/p PCI in 2020 (on Plavix), HLD.  Presents the emergency department for chest pain and left arm numbness.  Had onset of symptoms around midnight.  He was sitting and watching TV when it started.  No exertional activity.  Felt like his heart was "thumping out of my chest" with some numbness in his L forearm, ulnar side.  Took NTG PTA.  Waxing and waning nausea.  Reports some mild soreness in the left chest.  States that he had similar arm symptoms with his MI but had more chest pain with that.  No fever, slurred speech, back pain, abdominal pain, vomiting.       Past Medical History:  Diagnosis Date   Acute MI, lateral wall (HCC) 05/21/2019   Coronary artery disease    Hyperlipidemia    Hypertension    S/P angioplasty with stent to OM2  DES 05/21/2019   STEMI (ST elevation myocardial infarction) (HCC) 05/21/2019   Tobacco use     Patient Active Problem List   Diagnosis Date Noted   Hyperlipidemia 05/23/2019   Hypertension 05/23/2019   Acute MI, lateral wall (HCC) 05/21/2019   Acute MI, inferolateral wall [facility])     Past Surgical History:  Procedure Laterality Date   CORONARY/GRAFT ACUTE MI REVASCULARIZATION N/A 05/21/2019   Procedure: Coronary/Graft Acute MI Revascularization;  Surgeon: Corky Crafts, MD;  Location: [facility] INVASIVE CV LAB;  Service: Cardiovascular;  Laterality: N/A;   KNEE ARTHROSCOPY     LEFT HEART CATH AND CORONARY ANGIOGRAPHY N/A 05/21/2019   Procedure: LEFT HEART CATH AND CORONARY ANGIOGRAPHY;  Surgeon: Corky Crafts, MD;  Location: [facility] INVASIVE CV LAB;  Service: Cardiovascular;  Laterality: N/A;   SHOULDER SURGERY         Family History  Problem Relation Age of Onset    Diabetes Mother     Social History   Tobacco Use   Smoking status: Former    Packs/day: 1.00    Types: Cigarettes   Smokeless tobacco: Former    Types: Snuff  Vaping Use   Vaping Use: Never used  Substance Use Topics   Alcohol use: No   Drug use: No    Home Medications Prior to Admission medications   Medication Sig Start Date End Date Taking? Authorizing Provider  acetaminophen (TYLENOL) 325 MG tablet Take 650 mg by mouth every 6 (six) hours as needed for mild pain, fever or headache.    [provider]  allopurinol (ZYLOPRIM) 300 MG tablet Take 300 mg by mouth daily.  07/06/18   [provider]  atorvastatin (LIPITOR) 80 MG tablet Take 1 tablet (80 mg total) by mouth daily. 12/16/20   Corky Crafts, MD  calcium carbonate (TUMS - DOSED IN MG ELEMENTAL CALCIUM) 500 MG chewable tablet Chew 1,000-1,500 tablets by mouth 3 (three) times daily as needed for indigestion or heartburn.    [provider]  clopidogrel (PLAVIX) 75 MG tablet TAKE 4 TABLETS AS YOUR FIRST DOSE, THEN TAKE 1 TABLET BY MOUTH ONCE A DAY. 07/07/20   Leone Brand, NP  famotidine (PEPCID) 20 MG tablet TAKE 1 TABLET BY MOUTH EVERY DAY 03/31/20   Leone Brand, NP  lisinopril (  ZESTRIL) 2.5 MG tablet TAKE 1 TABLET BY MOUTH EVERY DAY 12/07/20   Corky Crafts, MD  metoprolol tartrate (LOPRESSOR) 25 MG tablet TAKE 1 TABLET BY MOUTH TWICE A DAY 12/07/20   Corky Crafts, MD  nitroGLYCERIN (NITROSTAT) 0.4 MG SL tablet Place 1 tablet (0.4 mg total) under the tongue every 5 (five) minutes as needed. Patient taking differently: Place 0.4 mg under the tongue every 5 (five) minutes as needed for chest pain. 05/23/19   Arty Baumgartner, NP  OZEMPIC, 0.25 OR 0.5 MG/DOSE, 2 MG/1.5ML SOPN Inject 0.5 mg into the skin once a week. 05/12/20   [provider]    Allergies    Morphine and related and Bee venom  Review of Systems   Review of Systems  Constitutional:  Negative for  diaphoresis and fever.  Eyes:  Negative for redness.  Respiratory:  Negative for cough and shortness of breath.   Cardiovascular:  Positive for chest pain. Negative for palpitations and leg swelling.  Gastrointestinal:  Positive for nausea. Negative for abdominal pain and vomiting.  Genitourinary:  Negative for dysuria.  Musculoskeletal:  Negative for back pain and neck pain.  Skin:  Negative for rash.  Neurological:  Positive for numbness. Negative for dizziness, syncope, facial asymmetry, speech difficulty, weakness, light-headedness and headaches.  Psychiatric/Behavioral:  The patient is not nervous/anxious.    Physical Exam Updated Vital Signs BP 126/72   Pulse 65   Temp 98.1 F (36.7 C) (Oral)   Resp 12   Ht 5\' 10"  (1.778 m)   Wt 121 kg   SpO2 99%   BMI 38.28 kg/m   Physical Exam Vitals and nursing note reviewed.  Constitutional:      Appearance: He is well-developed. He is not diaphoretic.  HENT:     Head: Normocephalic and atraumatic.     Right Ear: Tympanic membrane, ear canal and external ear normal.     Left Ear: Tympanic membrane, ear canal and external ear normal.     Nose: Nose normal.     Mouth/Throat:     Mouth: Mucous membranes are not dry.     Pharynx: Uvula midline.  Eyes:     General: Lids are normal.     Conjunctiva/sclera: Conjunctivae normal.     Pupils: Pupils are equal, round, and reactive to light.  Neck:     Vascular: Normal carotid pulses. No carotid bruit or JVD.     Trachea: Trachea normal. No tracheal deviation.  Cardiovascular:     Rate and Rhythm: Normal rate and regular rhythm.     Pulses: No decreased pulses.          Radial pulses are 2+ on the right side and 2+ on the left side.     Heart sounds: Normal heart sounds, S1 normal and S2 normal. Heart sounds not distant. No murmur heard.    Comments: 2+ radial pulse bilaterally.  Normal capillary refill in both hands. Pulmonary:     Effort: Pulmonary effort is normal. No respiratory  distress.     Breath sounds: Normal breath sounds. No wheezing.  Chest:     Chest wall: No tenderness.  Abdominal:     General: Bowel sounds are normal.     Palpations: Abdomen is soft.     Tenderness: There is no abdominal tenderness. There is no guarding or rebound.  Musculoskeletal:        General: Normal range of motion.     Cervical back: Normal range of motion  and neck supple. No tenderness or bony tenderness. No muscular tenderness.     Right lower leg: No edema.     Left lower leg: No edema.  Skin:    General: Skin is warm and dry.     Coloration: Skin is not pale.  Neurological:     Mental Status: He is alert and oriented to person, place, and time. Mental status is at baseline.     GCS: GCS eye subscore is 4. GCS verbal subscore is 5. GCS motor subscore is 6.     Cranial Nerves: No cranial nerve deficit.     Sensory: No sensory deficit.     Motor: No abnormal muscle tone.     Coordination: Coordination normal.     Gait: Gait normal.     Deep Tendon Reflexes: Reflexes are normal and symmetric.     Comments: Normal upper extremity strength bilaterally.  Patient reports decreased sensation of the right forearm, ulnar nerve distribution.  He can feel pressure.  Normal sensation in the upper arm proximal to the elbow and of the hand.  Psychiatric:        Mood and Affect: Mood normal.    ED Results / Procedures / Treatments   Labs (all labs ordered are listed, but only abnormal results are displayed) Labs Reviewed  BASIC METABOLIC PANEL - Abnormal; Notable for the following components:      Result Value   Glucose, Bld 152 (*)    Creatinine, Ser 1.41 (*)    All other components within normal limits  CBC  PROTIME-INR  CBG MONITORING, ED  TROPONIN I (HIGH SENSITIVITY)  TROPONIN I (HIGH SENSITIVITY)    EKG EKG Interpretation  Date/Time:  Sunday January 03 2021 06:14:15 EDT Ventricular Rate:  85 PR Interval:  168 QRS Duration: 76 QT Interval:  360 QTC  Calculation: 428 R Axis:   17 Text Interpretation: Normal sinus rhythm Normal ECG Confirmed by Lockie Mola, Adam ([id number]) on 01/03/2021 8:51:16 AM  Radiology DG Chest 2 View  Result Date: 01/03/2021 CLINICAL DATA:  Chest pain EXAM: CHEST - 2 VIEW COMPARISON:  06/03/2020 FINDINGS: The lungs are clear without focal pneumonia, edema, pneumothorax or pleural effusion. Interstitial markings are diffusely coarsened with chronic features. The cardiopericardial silhouette is within normal limits for size. The visualized bony structures of the thorax show no acute abnormality. IMPRESSION: Chronic interstitial coarsening. No acute cardiopulmonary findings. Electronically Signed   By: Kennith Center M.D.   On: 01/03/2021 06:58   CT Head Wo Contrast  Result Date: 01/03/2021 CLINICAL DATA:  Left facial and right arm numbness. EXAM: CT HEAD WITHOUT CONTRAST TECHNIQUE: Contiguous axial images were obtained from the base of the skull through the vertex without intravenous contrast. COMPARISON:  None. FINDINGS: Brain: There is no evidence for acute hemorrhage, hydrocephalus, mass lesion, or abnormal extra-axial fluid collection. No definite CT evidence for acute infarction. Vascular: No hyperdense vessel or unexpected calcification. Skull: No evidence for fracture. No worrisome lytic or sclerotic lesion. Sinuses/Orbits: The visualized paranasal sinuses and mastoid air cells are clear. Visualized portions of the globes and intraorbital fat are unremarkable. Other: None. IMPRESSION: Unremarkable exam.  No acute intracranial abnormality. Electronically Signed   By: Kennith Center M.D.   On: 01/03/2021 07:31   MR Brain Wo Contrast (neuro protocol)  Result Date: 01/03/2021 CLINICAL DATA:  Left arm numbness. EXAM: MRI HEAD WITHOUT CONTRAST TECHNIQUE: Multiplanar, multiecho pulse sequences of the brain and surrounding structures were obtained without intravenous contrast. COMPARISON:  Head CT  01/03/2021 FINDINGS: Brain: There is no  evidence of an acute infarct, intracranial hemorrhage, mass, midline shift, or extra-axial fluid collection. The ventricles and sulci are normal. No significant white matter disease is evident. Vascular: Major intracranial vascular flow voids are preserved. Skull and upper cervical spine: Unremarkable bone marrow signal. Sinuses/Orbits: Unremarkable orbits. Mild mucosal thickening in the left maxillary sinus. Trapped fluid in the left petrous apex. Other: None. IMPRESSION: Unremarkable appearance of the brain. Electronically Signed   By: Sebastian AcheAllen  Grady M.D.   On: 01/03/2021 13:12    Procedures Procedures   Medications Ordered in ED Medications - No data to display  ED Course  I have reviewed the triage vital signs and the nursing notes.  Pertinent labs & imaging results that were available during my care of the patient were reviewed by me and considered in my medical decision making (see chart for details).  Patient seen and examined. Work-up initiated. Medications ordered.  First troponin negative.  Awaiting second, currently being run.  Head CT was reassuring.  Will discuss with Dr. Lockie Molauratolo in regards to MRI to evaluate for stroke.  Vital signs reviewed and are as follows: BP 126/72   Pulse 65   Temp 98.1 F (36.7 C) (Oral)   Resp 12   Ht 5\' 10"  (1.778 m)   Wt 121 kg   SpO2 99%   BMI 38.28 kg/m   Patient discussed with and seen by Dr. Lockie Molauratolo.  MRI ordered to rule out acute CVA.  Patient stable during ED stay.  MRI negative.  Patient and family updated on results.  Plan for discharged home with PCP follow-up.  Patient counseled to return if they have weakness in their arms or legs, slurred speech, trouble walking or talking, confusion, trouble with their balance, or if they have any other concerns. Patient verbalizes understanding and agrees with plan.  Patient was counseled to return with severe chest pain, especially if the pain is crushing or pressure-like and spreads to the  arms, back, neck, or jaw, or if they have sweating, nausea, or shortness of breath with the pain. They were encouraged to call 911 with these symptoms.   The patient verbalized understanding and agreed.     MDM Rules/Calculators/A&P                           Chest pain: History of MI.  Troponin negative today x2.  Chest x-ray clear.  No concerning features for PE or dissection.  Low concern for active ACS.  No atypical features such as exertional pain, vomiting.  Arm numbness: Likely peripheral.  Concern for acute stroke due to risk factor profile, MRI performed and was negative.  No weakness.  No skin findings.  Plan for PCP follow-up.   Final Clinical Impression(s) / ED Diagnoses Final diagnoses:  Numbness  Precordial pain    Rx / DC Orders ED Discharge Orders     None        Renne CriglerGeiple, Arvetta Araque, PA-C 01/03/21 1528    Virgina Norfolkuratolo, Adam, DO 01/04/21 2324

## 2021-01-03 NOTE — ED Triage Notes (Signed)
Pt arrives POV for eval of numbness in L arm onset possibly around midnight, unsure of exact onset, as well as facial numbness around the same time with associated L sided chest tightness and "heart thumping out of chest". Aside from numbness, pt is neuro intact in triage, no weakness, no facial droop, no slurred speech. Endorses hx of 1 cardiac stent in the past, states sx are not similar. Evaluated by Carmie Kanner PA-C in triage, no code stroke indicated at this time.

## 2021-01-03 NOTE — ED Provider Notes (Signed)
I personally evaluated the patient during the encounter and completed a history, physical, procedures, medical decision making to contribute to the overall care of the patient and decision making for the patient briefly, the patient is a 50 y.o. male here with chest pain left arm numbness.  I have been asked to evaluate him after work-up has been completed.  Troponin normal x2.  EKG shows sinus rhythm.  Not having any chest pain.  Overall no concern for ACS or PE.  He is mostly concerned about left arm numbness that started overnight.  Numbness from the left shoulder down mostly in the ulnar portion of the left arm.  He does have stroke risk factors and overall we will get an MRI to rule out small vessel stroke.  No other neurologic symptoms.  Dispo per MRI.  See PA note for further results, evaluation and disposition of the patient.  This chart was dictated using voice recognition software.  Despite best efforts to proofread,  errors can occur which can change the documentation meaning.    EKG Interpretation  Date/Time:  Sunday January 03 2021 06:14:15 EDT Ventricular Rate:  85 PR Interval:  168 QRS Duration: 76 QT Interval:  360 QTC Calculation: 428 R Axis:   17 Text Interpretation: Normal sinus rhythm Normal ECG Confirmed by Lockie Mola, Boomer Winders ([id number]) on 01/03/2021 8:51:16 AM            Virgina Norfolk, DO 01/03/21 1205

## 2021-01-03 NOTE — Discharge Instructions (Signed)
Please read and follow all provided instructions.  Your diagnoses today include:  1. Numbness   2. Precordial pain     Tests performed today include: An EKG of your heart A chest x-ray Cardiac enzymes - a blood test for heart muscle damage, no sign of heart attack today Blood counts and electrolytes MRI brain - no sign of stroke Vital signs. See below for your results today.   Medications prescribed:  None  Take any prescribed medications only as directed.  Follow-up instructions: Please follow-up with your primary care provider as soon as you can for further evaluation of your symptoms.   Return instructions:  SEEK IMMEDIATE MEDICAL ATTENTION IF: You have severe chest pain, especially if the pain is crushing or pressure-like and spreads to the arms, back, neck, or jaw, or if you have sweating, nausea (feeling sick to your stomach), or shortness of breath. THIS IS AN EMERGENCY. Don't wait to see if the pain will go away. Get medical help at once. Call 911 or 0 (operator). DO NOT drive yourself to the hospital.  Your chest pain gets worse and does not go away with rest.  You have an attack of chest pain lasting longer than usual, despite rest and treatment with the medications your caregiver has prescribed.  You wake from sleep with chest pain or shortness of breath. You feel dizzy or faint. You have chest pain not typical of your usual pain for which you originally saw your caregiver.  You have any other emergent concerns regarding your health.  Additional Information: Chest pain comes from many different causes. Your caregiver has diagnosed you as having chest pain that is not specific for one problem, but does not require admission.  You are at low risk for an acute heart condition or other serious illness.   Your vital signs today were: BP (!) 148/106   Pulse 65   Temp 98.1 F (36.7 C) (Oral)   Resp 20   Ht 5\' 10"  (1.778 m)   Wt 121 kg   SpO2 97%   BMI 38.28 kg/m  If  your blood pressure (BP) was elevated above 135/85 this visit, please have this repeated by your doctor within one month. --------------

## 2021-01-03 NOTE — ED Notes (Signed)
Pt back from MRI, ambulated to restroom with no assistance needed

## 2021-01-03 NOTE — ED Notes (Signed)
Patient transported to MRI 

## 2021-01-03 NOTE — ED Provider Notes (Signed)
Emergency Medicine Provider Triage Evaluation Note  Timothy [name]. , a 50 y.o. male  was evaluated in triage.  Difficult historian.  Pt complains of "feeling funny".  Had onset of symptoms around midnight.  Felt like his heart was "thumping out of my chest" with some numbness in his L arm.  Took NTG PTA and now feels like this numbness has extended to the L side of his face and proximal R arm.  Waxing and waning nausea.  Reports feeling "like I was beat up from the inside out" for the past 10 days; chest soreness around his "rib cage". No fever, slurred speech, back pain, abdominal pain, vomiting. Hx of HTN, CAD s/p PCI (on Plavix), HLD.  Review of Systems  Positive: Palpitations, chest pain, numbness Negative: Fever, abdominal pain, back pain  Physical Exam  There were no vitals taken for this visit. Gen:   Awake, no distress   Resp:  Normal effort  MSK:   Moves extremities without difficulty  Other:  GCS 15. Speech is goal oriented. No cranial nerve deficits appreciated; symmetric eyebrow raise, no facial drooping, tongue midline. Patient has equal grip strength bilaterally with 5/5 strength against resistance in all major muscle groups bilaterally. Sensation to light touch intact. Patient moves extremities without ataxia. No pronator drift.    Medical Decision Making  Medically screening exam initiated at 6:21 AM.  Appropriate orders placed.  Timothy [name]. was informed that the remainder of the evaluation will be completed by another provider, this initial triage assessment does not replace that evaluation, and the importance of remaining in the ED until their evaluation is complete.  Chest pain, extremity numbness   Antony Madura, PA-C 01/03/21 7169    Palumbo, April, MD 01/03/21 [phone]

## 2021-01-05 DIAGNOSIS — R0781 Pleurodynia: Secondary | ICD-10-CM | POA: Diagnosis not present

## 2021-01-05 DIAGNOSIS — R197 Diarrhea, unspecified: Secondary | ICD-10-CM | POA: Diagnosis not present

## 2021-01-28 DIAGNOSIS — E1159 Type 2 diabetes mellitus with other circulatory complications: Secondary | ICD-10-CM | POA: Diagnosis not present

## 2021-01-28 DIAGNOSIS — I10 Essential (primary) hypertension: Secondary | ICD-10-CM | POA: Diagnosis not present

## 2021-01-28 DIAGNOSIS — M109 Gout, unspecified: Secondary | ICD-10-CM | POA: Diagnosis not present

## 2021-01-28 DIAGNOSIS — E7800 Pure hypercholesterolemia, unspecified: Secondary | ICD-10-CM | POA: Diagnosis not present

## 2021-02-05 DIAGNOSIS — Z1211 Encounter for screening for malignant neoplasm of colon: Secondary | ICD-10-CM | POA: Diagnosis not present

## 2021-02-05 DIAGNOSIS — M109 Gout, unspecified: Secondary | ICD-10-CM | POA: Diagnosis not present

## 2021-02-05 DIAGNOSIS — E7800 Pure hypercholesterolemia, unspecified: Secondary | ICD-10-CM | POA: Diagnosis not present

## 2021-02-05 DIAGNOSIS — Z0000 Encounter for general adult medical examination without abnormal findings: Secondary | ICD-10-CM | POA: Diagnosis not present

## 2021-02-17 ENCOUNTER — Telehealth: Payer: Self-pay | Admitting: *Deleted

## 2021-02-17 DIAGNOSIS — K625 Hemorrhage of anus and rectum: Secondary | ICD-10-CM | POA: Diagnosis not present

## 2021-02-17 DIAGNOSIS — R1084 Generalized abdominal pain: Secondary | ICD-10-CM | POA: Diagnosis not present

## 2021-02-17 DIAGNOSIS — I2510 Atherosclerotic heart disease of native coronary artery without angina pectoris: Secondary | ICD-10-CM | POA: Diagnosis not present

## 2021-02-17 NOTE — Telephone Encounter (Signed)
   [facility] Pre-operative Risk Assessment    Patient Name: Timothy [name].  DOB: Nov 19, 1970 MRN: [id number]  HEARTCARE STAFF:  - IMPORTANT!!!!!! Under Visit Info/Reason for Call, type in Other and utilize the format Clearance MM/DD/YY or Clearance TBD. Do not use dashes or single digits. - Please review there is not already an duplicate clearance open for this procedure. - If request is for dental extraction, please clarify the # of teeth to be extracted. - If the patient is currently at the dentist's office, call Pre-Op Callback Staff (MA/nurse) to input urgent request.  - If the patient is not currently in the dentist office, please route to the Pre-Op pool.  Request for surgical clearance:  What type of surgery is being performed? COLONOSCOPY  When is this surgery scheduled? 03/02/21  What type of clearance is required (medical clearance vs. Pharmacy clearance to hold med vs. Both)? MEDICAL  Are there any medications that need to be held prior to surgery and how long? PLAVIX   Practice name and name of physician performing surgery? [facility]; DR. HUNG  What is the office phone number? [phone]   7.   What is the office fax number? [phone]  8.   Anesthesia type (None, local, MAC, general) ? PROPOFOL   Julaine Hua 02/17/2021, 11:18 AM  _________________________________________________________________   (provider comments below)

## 2021-02-17 NOTE — Telephone Encounter (Signed)
Dr. Eldridge Dace Pt had PCI in 2020 was on DAPT with ASA and plavix. ASA stopped at last visit for blood in stool. He is on plavix monotherapy. OK to hold for colonoscopy?

## 2021-02-22 NOTE — Telephone Encounter (Signed)
   Name: [facility]  DOB: 04-28-1971  MRN: [id number]   Primary Cardiologist: Lance Muss, MD  Chart reviewed as part of pre-operative protocol coverage. Patient was contacted 02/22/2021 in reference to pre-operative risk assessment for pending surgery as outlined below.  Travion [employer]. was last seen on 10/26/20 by Dr. Eldridge Dace.  Since that day, [employer]. has done well. He can complete more than 4.0 METS without angina. He is greater than 12 months from PCI.  Per Dr. Eldridge Dace: OK to hold Plavix for 5 days prior to colonoscopy.   Therefore, based on ACC/AHA guidelines, the patient would be at acceptable risk for the planned procedure without further cardiovascular testing.   The patient was advised that if he develops new symptoms prior to surgery to contact our office to arrange for a follow-up visit, and he verbalized understanding.  I will route this recommendation to the requesting party via Epic fax function and remove from pre-op pool. Please call with questions.  Roe Rutherford Mackensey Bolte, PA 02/22/2021, 1:21 PM

## 2021-02-22 NOTE — Telephone Encounter (Signed)
Left VM

## 2021-03-02 DIAGNOSIS — Z1211 Encounter for screening for malignant neoplasm of colon: Secondary | ICD-10-CM | POA: Diagnosis not present

## 2021-03-30 IMAGING — CR DG CHEST 2V
2 series · 2 of 2 positions shown · non-contrast
Comparison: None.

CLINICAL DATA: Shortness of breath.  Hypertension.

EXAM:
CHEST - 2 VIEW

[w chest pa]
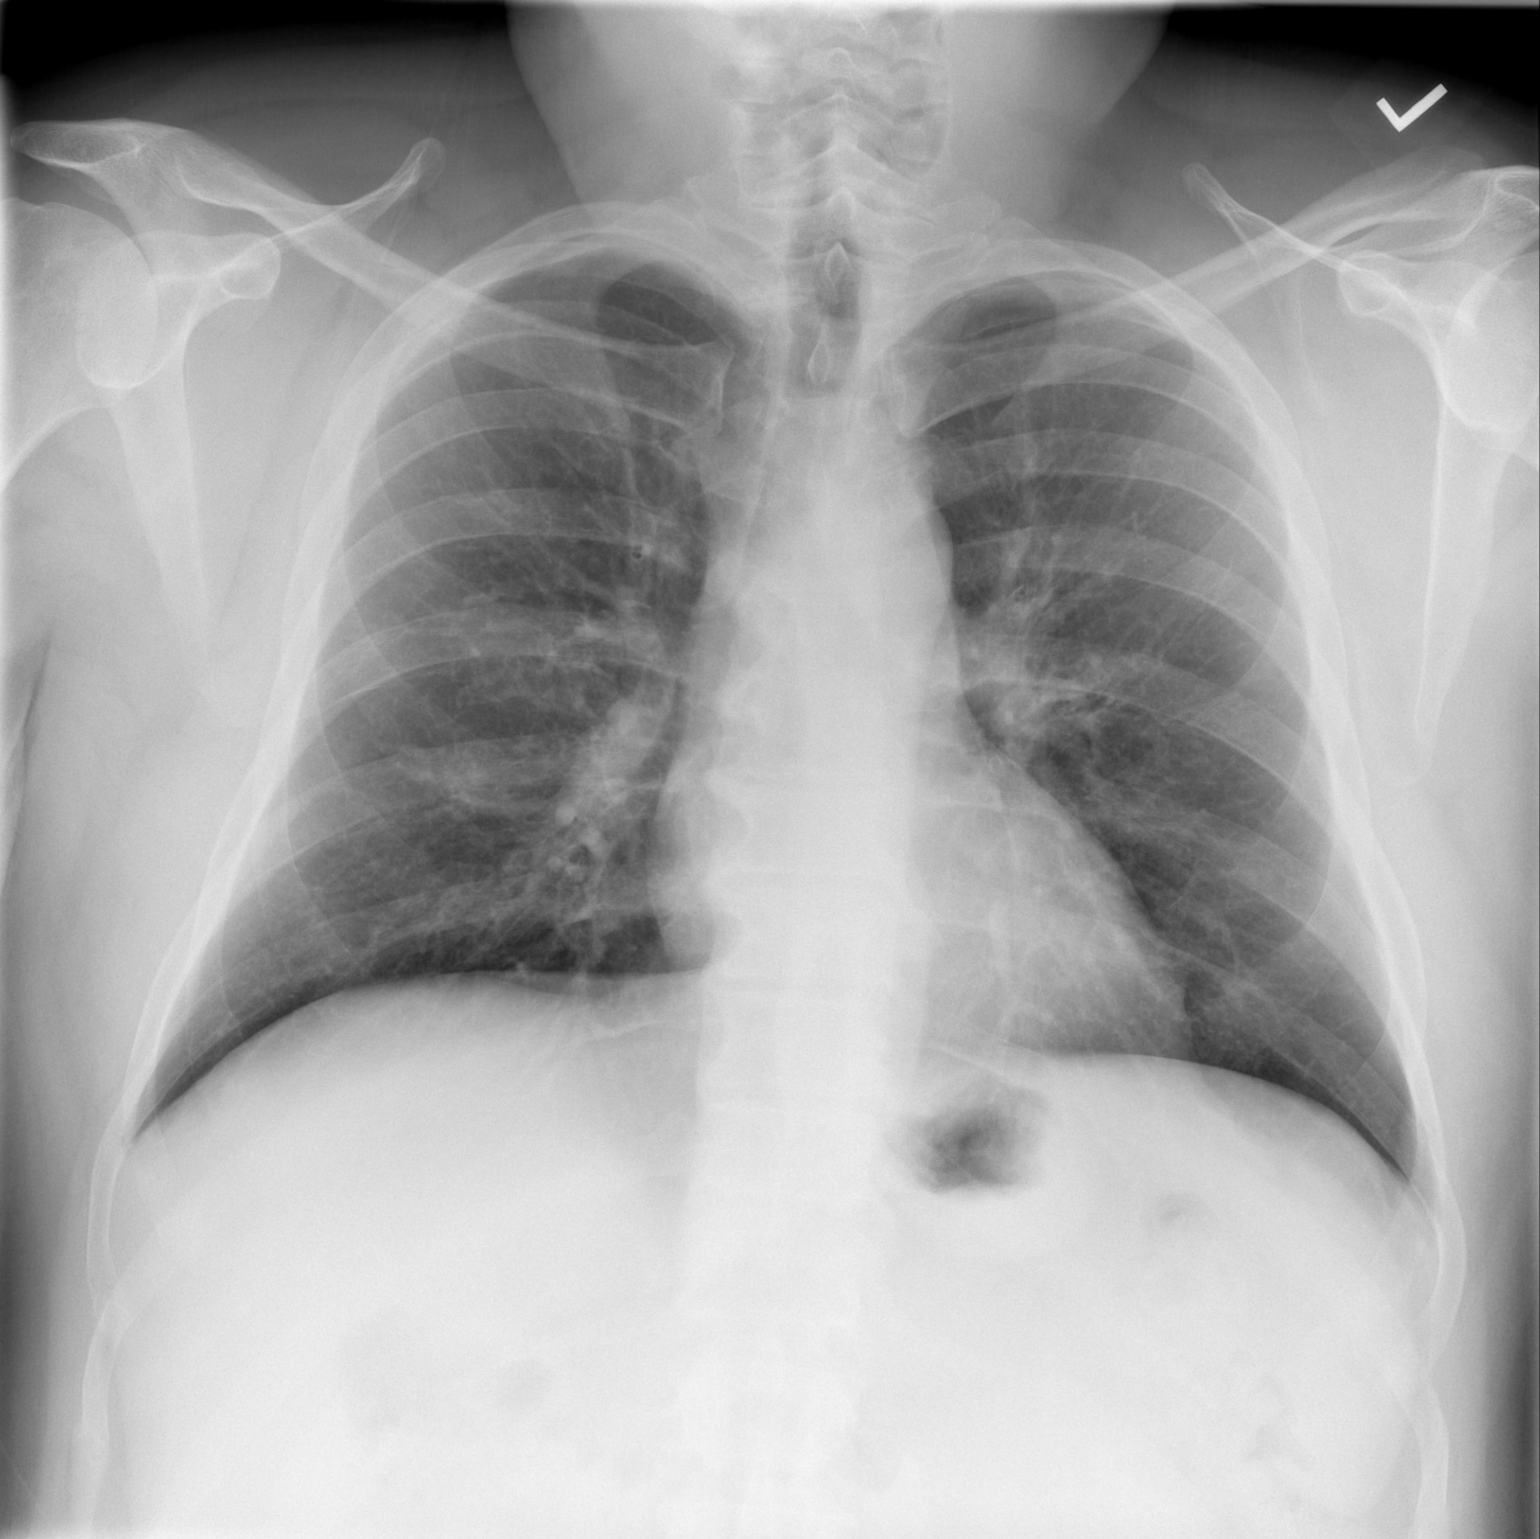

[w chest lat]
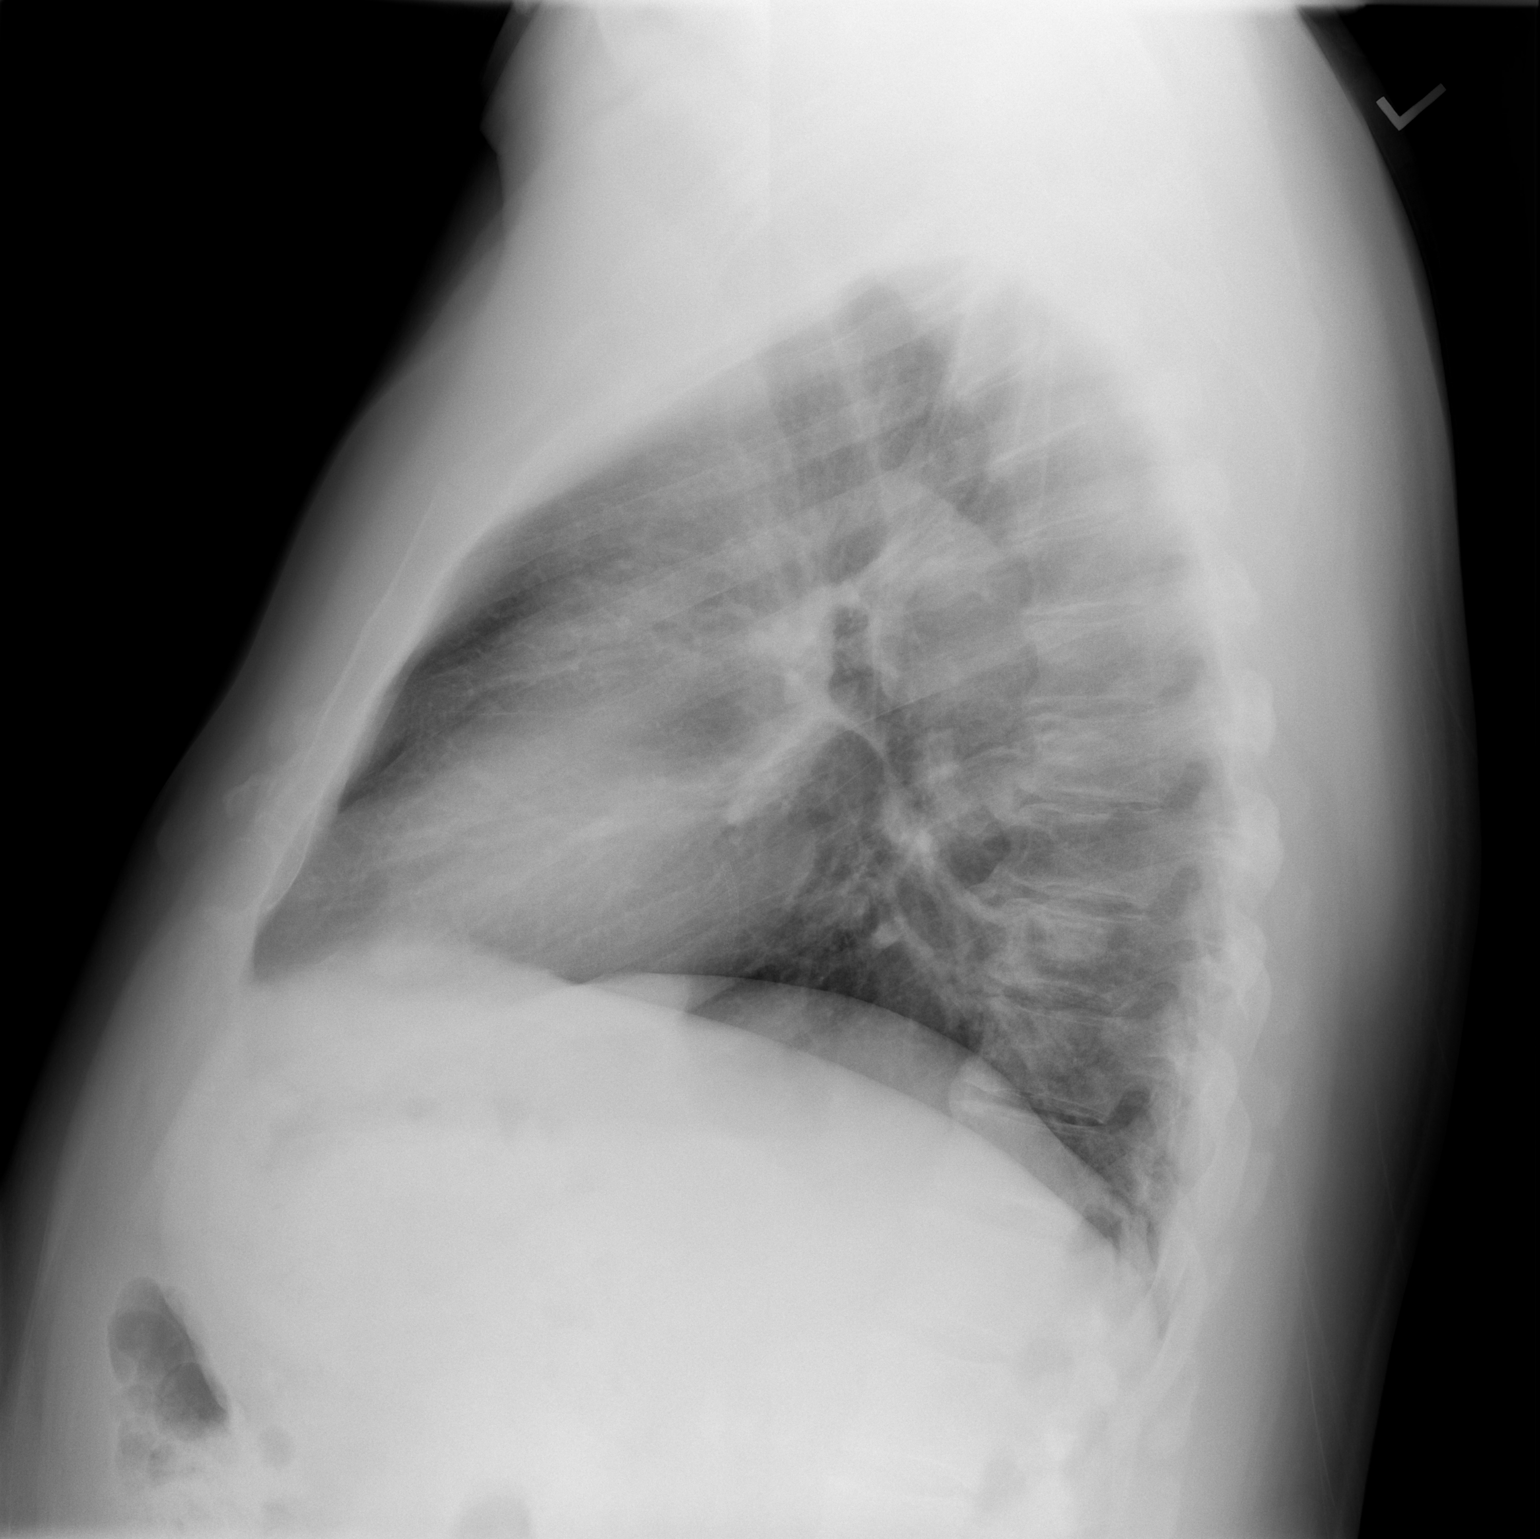

[2 of 2 positions shown; findings below may reference images not displayed]

FINDINGS: Lungs are clear. Heart size and pulmonary vascularity are normal. No
adenopathy. There is degenerative change in the thoracic spine.
IMPRESSION: Lungs clear. Cardiac silhouette within normal limits. No adenopathy.

## 2021-07-29 ENCOUNTER — Other Ambulatory Visit: Payer: Self-pay | Admitting: Interventional Cardiology

## 2021-07-29 DIAGNOSIS — I10 Essential (primary) hypertension: Secondary | ICD-10-CM

## 2021-07-29 DIAGNOSIS — I2510 Atherosclerotic heart disease of native coronary artery without angina pectoris: Secondary | ICD-10-CM

## 2021-07-30 ENCOUNTER — Other Ambulatory Visit: Payer: Self-pay | Admitting: Cardiology

## 2021-08-03 DIAGNOSIS — E7800 Pure hypercholesterolemia, unspecified: Secondary | ICD-10-CM | POA: Diagnosis not present

## 2021-08-03 DIAGNOSIS — E1159 Type 2 diabetes mellitus with other circulatory complications: Secondary | ICD-10-CM | POA: Diagnosis not present

## 2021-08-03 DIAGNOSIS — I10 Essential (primary) hypertension: Secondary | ICD-10-CM | POA: Diagnosis not present

## 2021-08-10 DIAGNOSIS — M109 Gout, unspecified: Secondary | ICD-10-CM | POA: Diagnosis not present

## 2021-08-10 DIAGNOSIS — E1159 Type 2 diabetes mellitus with other circulatory complications: Secondary | ICD-10-CM | POA: Diagnosis not present

## 2021-08-10 DIAGNOSIS — I10 Essential (primary) hypertension: Secondary | ICD-10-CM | POA: Diagnosis not present

## 2021-08-10 DIAGNOSIS — E7800 Pure hypercholesterolemia, unspecified: Secondary | ICD-10-CM | POA: Diagnosis not present

## 2021-09-04 ENCOUNTER — Other Ambulatory Visit: Payer: Self-pay | Admitting: Cardiology

## 2021-09-10 DIAGNOSIS — E1159 Type 2 diabetes mellitus with other circulatory complications: Secondary | ICD-10-CM | POA: Diagnosis not present

## 2021-09-29 DIAGNOSIS — E1159 Type 2 diabetes mellitus with other circulatory complications: Secondary | ICD-10-CM | POA: Diagnosis not present

## 2021-09-29 DIAGNOSIS — M5417 Radiculopathy, lumbosacral region: Secondary | ICD-10-CM | POA: Diagnosis not present

## 2021-09-29 DIAGNOSIS — R1030 Lower abdominal pain, unspecified: Secondary | ICD-10-CM | POA: Diagnosis not present

## 2021-11-23 ENCOUNTER — Telehealth: Payer: Self-pay

## 2021-11-23 NOTE — Telephone Encounter (Signed)
   Pre-operative Risk Assessment    Patient Name: Timothy [name].  DOB: [birth year] MRN: [id number]     Request for Surgical Clearance    Procedure:  Dental Extraction - Amount of Teeth to be Pulled:  1  Date of Surgery:  Clearance TBD                                 Surgeon:  Dr. [place] Thomas Surgeon's Group or Practice Name:  [facility] Phone number:  [phone] Fax number:  [phone]   Type of Clearance Requested:   - Medical  - Pharmacy:  Hold Clopidogrel (Plavix) Any special instructions?   Type of Anesthesia:  Local    Additional requests/questions:    Signed, Kaheem Halleck   11/23/2021, 1:29 PM

## 2021-11-24 NOTE — Telephone Encounter (Signed)
   Primary Cardiologist: Larae Grooms, MD  Chart reviewed as part of pre-operative protocol coverage. Simple dental extractions are considered low risk procedures per guidelines and generally do not require any specific cardiac clearance. It is also generally accepted that for simple extractions and dental cleanings, there is no need to interrupt blood thinner therapy.   SBE prophylaxis is not required for the patient.  I will route this recommendation to the requesting party via Epic fax function and remove from pre-op pool.  Please call with questions.  Emmaline Life, NP-C    11/24/2021, 12:01 PM [place] [id number], [address] Office [phone] Fax [phone]

## 2021-12-23 ENCOUNTER — Other Ambulatory Visit: Payer: Self-pay | Admitting: Interventional Cardiology

## 2021-12-23 DIAGNOSIS — I2510 Atherosclerotic heart disease of native coronary artery without angina pectoris: Secondary | ICD-10-CM

## 2021-12-23 DIAGNOSIS — I10 Essential (primary) hypertension: Secondary | ICD-10-CM

## 2022-01-07 NOTE — Progress Notes (Unsigned)
Cardiology Office Note   Date:  01/10/2022   ID:  Timothy Lenz., DOB 01-28-1971, MRN [id number]  PCP:  Irena Reichmann, DO    No chief complaint on file.  CAD/Old MI  Wt Readings from Last 3 Encounters:  01/10/22 251 lb (113.9 kg)  01/03/21 266 lb 12.1 oz (121 kg)  10/26/20 268 lb 6.4 oz (121.7 kg)       History of Present Illness: Timothy Thoma. is a 51 y.o. male  who presents who has a history of HTN.  In 05/2019, He complained of acute onset mid chest pressure/gas like indigestion feeling 'in the middle of the day', at rest, lasted an hour or so and went away. Later that evening, at home, after showering, felt similar pain/discomfort, mid chest, radiating to left arm, with sob. Symptoms were  acute onset, moderate, persistent, constant, dull/pressure. Stated symptoms were similar milder/briefer pain in past.   EKG noted with inferolateral STEMI.    Cath showed: "Mid LAD lesion is 50% stenosed. Mid RCA lesion is 25% stenosed. 2nd Mrg lesion is 99% stenosed. A drug-eluting stent was successfully placed using a STENT RESOLUTE ONYX 2.0X22. Post intervention, there is a 0% residual stenosis. The left ventricular systolic function is normal. LV end diastolic pressure is normal. The left ventricular ejection fraction is 50-55% by visual estimate. There is no aortic valve stenosis. Short aortic arch making JL3.5 difficult to use. EBU 3 Guide catheter was used to engage left main/LAD. EBU 3.5 may be a better choice if PCI was needed.   Continue aggressive secondary prevention along with DAPT.     We spoke about stopping smoking and other lifestyle modifications."   Follow up echo showed LVEF=50-55%, mild hypokinesis of the basal mid inferolateral wall. He was able to tolerate the addition of metoprolol 25mg  BID and lisinopril 2.5mg  daily. Worked well with cardiac rehab without recurrent chest pain.   Had some rectal bleeding in 2022.  Has borderline DM.  Did not tolerate  Ozempic.  2023 was started.  He lost some weight.   Denies : Chest pain. Dizziness. Leg edema. Nitroglycerin use. Orthopnea. Palpitations. Paroxysmal nocturnal dyspnea. Syncope.    He walks 2x/ week for exercise.  Hikes without issues.   Past Medical History:  Diagnosis Date   Acute MI, lateral wall (HCC) 05/21/2019   Coronary artery disease    Hyperlipidemia    Hypertension    S/P angioplasty with stent to OM2  DES 05/21/2019   STEMI (ST elevation myocardial infarction) (HCC) 05/21/2019   Tobacco use     Past Surgical History:  Procedure Laterality Date   CORONARY/GRAFT ACUTE MI REVASCULARIZATION N/A 05/21/2019   Procedure: Coronary/Graft Acute MI Revascularization;  Surgeon: 14/01/2019, MD;  Location: [facility] INVASIVE CV LAB;  Service: Cardiovascular;  Laterality: N/A;   KNEE ARTHROSCOPY     LEFT HEART CATH AND CORONARY ANGIOGRAPHY N/A 05/21/2019   Procedure: LEFT HEART CATH AND CORONARY ANGIOGRAPHY;  Surgeon: 14/01/2019, MD;  Location: [facility] INVASIVE CV LAB;  Service: Cardiovascular;  Laterality: N/A;   SHOULDER SURGERY       Current Outpatient Medications  Medication Sig Dispense Refill   acetaminophen (TYLENOL) 325 MG tablet Take 650 mg by mouth every 6 (six) hours as needed for mild pain, fever or headache.     allopurinol (ZYLOPRIM) 300 MG tablet Take 300 mg by mouth daily.      atorvastatin (LIPITOR) 80 MG tablet Take 1 tablet (80 mg total) by  mouth daily. Please call [phone] to schedule an overdue appointment for future refills. Thank you. 1st attempt. 30 tablet 0   calcium carbonate (TUMS - DOSED IN MG ELEMENTAL CALCIUM) 500 MG chewable tablet Chew 1,000-1,500 tablets by mouth 3 (three) times daily as needed for indigestion or heartburn.     clopidogrel (PLAVIX) 75 MG tablet TAKE 1 TABLET (75 MG TOTAL) BY MOUTH DAILY. SCHEDULE AN APPOINTMENT FOR FURTHER REFILLS, 1ST ATTEMPT 60 tablet 2   Empagliflozin-metFORMIN HCl ER (SYNJARDY XR) 12.10-998 MG TB24  Take 1 tablet by mouth daily.     famotidine (PEPCID) 20 MG tablet TAKE 1 TABLET BY MOUTH EVERY DAY (Patient taking differently: Take 20 mg by mouth daily.) 90 tablet 1   lisinopril (ZESTRIL) 2.5 MG tablet Take 1 tablet (2.5 mg total) by mouth daily. 90 tablet 1   metoprolol tartrate (LOPRESSOR) 25 MG tablet TAKE 1 TABLET BY MOUTH TWICE A DAY 180 tablet 1   nitroGLYCERIN (NITROSTAT) 0.4 MG SL tablet Place 1 tablet (0.4 mg total) under the tongue every 5 (five) minutes as needed. (Patient taking differently: Place 0.4 mg under the tongue every 5 (five) minutes as needed for chest pain.) 25 tablet 2   OZEMPIC, 0.25 OR 0.5 MG/DOSE, 2 MG/1.5ML SOPN Inject 0.5 mg into the skin once a week. (Patient not taking: Reported on 01/10/2022)     No current facility-administered medications for this visit.    Allergies:   Morphine and related and Bee venom    Social History:  The patient  reports that he has quit smoking. His smoking use included cigarettes. He smoked an average of 1 pack per day. He has quit using smokeless tobacco.  His smokeless tobacco use included snuff. He reports that he does not drink alcohol and does not use drugs.   Family History:  The patient's family history includes Diabetes in his mother.    ROS:  Please see the history of present illness.   Otherwise, review of systems are positive for intentional weight loss-20 lbs.   All other systems are reviewed and negative.    PHYSICAL EXAM: VS:  BP 112/70   Pulse 80   Ht 5\' 10"  (1.778 m)   Wt 251 lb (113.9 kg)   SpO2 96%   BMI 36.01 kg/m  , BMI Body mass index is 36.01 kg/m. GEN: Well nourished, well developed, in no acute distress HEENT: normal Neck: no JVD, carotid bruits, or masses Cardiac: RRR; no murmurs, rubs, or gallops,no edema  Respiratory:  clear to auscultation bilaterally, normal work of breathing GI: soft, nontender, nondistended, + BS MS: no deformity or atrophy Skin: warm and dry, no rash Neuro:  Strength  and sensation are intact Psych: euthymic mood, full affect   EKG:   The ekg ordered today demonstrates NSR, LAD, no ST changes   Recent Labs: No results found for requested labs within last 365 days.   Lipid Panel    Component Value Date/Time   CHOL 132 07/04/2019 1216   TRIG 152 (H) 07/04/2019 1216   HDL 35 (L) 07/04/2019 1216   CHOLHDL 3.8 07/04/2019 1216   CHOLHDL 6.3 05/21/2019 2205   VLDL 62 (H) 05/21/2019 2205   LDLCALC 71 07/04/2019 1216     Other studies Reviewed: Additional studies/ records that were reviewed today with results demonstrating: TG 441 in 2023.   ASSESSMENT AND PLAN:  CAD/Old MI: No further bleeding issues since stopping aspirin.  No angina on medical therapy.  Continue to try to  get to the exercise target noted below. Hyperlipidemia:  The current medical regimen is effective;  continue present plan and medications.  Whole food, plant-based diet.High-fiber diet. avoid processed foods. Also uses metamucil.  High triglyceride, low HDL.  We will check with our Pharm.D. to see if other lipid lowering therapy would be indicated.  Recheck lipids today. Tobacco abuse: Quit at the time of MI.  DM: A1C 6.4 in 2022 while on Ozempic.  After stopping Ozempic, A1c increased to 11.5.  Will check A1c today now that Synjardy started.   Current medicines are reviewed at length with the patient today.  The patient concerns regarding his medicines were addressed.  The following changes have been made:  No change  Labs/ tests ordered today include:  No orders of the defined types were placed in this encounter.   Recommend 150 minutes/week of aerobic exercise Low fat, low carb, high fiber diet recommended  Disposition:   FU in 1 year   Signed, Lance Muss, MD  01/10/2022 10:54 AM    [facility] [address] [place], [place], [place]  [address] Phone: [phone]; Fax: [phone]

## 2022-01-10 ENCOUNTER — Encounter: Payer: Self-pay | Admitting: Interventional Cardiology

## 2022-01-10 ENCOUNTER — Ambulatory Visit (INDEPENDENT_AMBULATORY_CARE_PROVIDER_SITE_OTHER): Payer: BC Managed Care – PPO | Admitting: Interventional Cardiology

## 2022-01-10 VITALS — BP 112/70 | HR 80 | Ht 70.0 in | Wt 251.0 lb

## 2022-01-10 DIAGNOSIS — I10 Essential (primary) hypertension: Secondary | ICD-10-CM

## 2022-01-10 DIAGNOSIS — E782 Mixed hyperlipidemia: Secondary | ICD-10-CM

## 2022-01-10 DIAGNOSIS — E1159 Type 2 diabetes mellitus with other circulatory complications: Secondary | ICD-10-CM

## 2022-01-10 DIAGNOSIS — I2510 Atherosclerotic heart disease of native coronary artery without angina pectoris: Secondary | ICD-10-CM

## 2022-01-10 DIAGNOSIS — Z720 Tobacco use: Secondary | ICD-10-CM

## 2022-01-10 LAB — COMPREHENSIVE METABOLIC PANEL
ALT: 22 IU/L (ref 0–44)
AST: 14 IU/L (ref 0–40)
Albumin/Globulin Ratio: 1.6 (ref 1.2–2.2)
Albumin: 3.9 g/dL (ref 3.8–4.9)
Alkaline Phosphatase: 109 IU/L (ref 44–121)
BUN/Creatinine Ratio: 9 (ref 9–20)
BUN: 10 mg/dL (ref 6–24)
Bilirubin Total: 0.5 mg/dL (ref 0.0–1.2)
CO2: 23 mmol/L (ref 20–29)
Calcium: 9.4 mg/dL (ref 8.7–10.2)
Chloride: 104 mmol/L (ref 96–106)
Creatinine, Ser: 1.07 mg/dL (ref 0.76–1.27)
Globulin, Total: 2.5 g/dL (ref 1.5–4.5)
Glucose: 152 mg/dL — ABNORMAL HIGH (ref 70–99)
Potassium: 4.3 mmol/L (ref 3.5–5.2)
Sodium: 137 mmol/L (ref 134–144)
Total Protein: 6.4 g/dL (ref 6.0–8.5)
eGFR: 83 mL/min/{1.73_m2} (ref >59)

## 2022-01-10 LAB — LIPID PANEL
Chol/HDL Ratio: 3.9 ratio (ref 0.0–5.0)
Cholesterol, Total: 144 mg/dL (ref 100–199)
HDL: 37 mg/dL — ABNORMAL LOW (ref >39)
LDL Chol Calc (NIH): 86 mg/dL (ref 0–99)
Triglycerides: 117 mg/dL (ref 0–149)
VLDL Cholesterol Cal: 21 mg/dL (ref 5–40)

## 2022-01-10 LAB — HEMOGLOBIN A1C
Est. average glucose Bld gHb Est-mCnc: 148 mg/dL
Hgb A1c MFr Bld: 6.8 % — ABNORMAL HIGH (ref 4.8–5.6)

## 2022-01-10 NOTE — Patient Instructions (Signed)
Medication Instructions:  Your physician recommends that you continue on your current medications as directed. Please refer to the Current Medication list given to you today.  *If you need a refill on your cardiac medications before your next appointment, please call your pharmacy*   Lab Work: Lab work to be done today--CMET, Lipids, A1C If you have labs (blood work) drawn today and your tests are completely normal, you will receive your results only by: MyChart Message (if you have MyChart) OR A paper copy in the mail If you have any lab test that is abnormal or we need to change your treatment, we will call you to review the results.   Testing/Procedures: none   Follow-Up: At [facility], you and your health needs are our priority.  As part of our continuing mission to provide you with exceptional heart care, we have created designated Provider Care Teams.  These Care Teams include your primary Cardiologist (physician) and Advanced Practice Providers (APPs -  Physician Assistants and Nurse Practitioners) who all work together to provide you with the care you need, when you need it.  We recommend signing up for the patient portal called "MyChart".  Sign up information is provided on this After Visit Summary.  MyChart is used to connect with patients for Virtual Visits (Telemedicine).  Patients are able to view lab/test results, encounter notes, upcoming appointments, etc.  Non-urgent messages can be sent to your provider as well.   To learn more about what you can do with MyChart, go to [email or web address].    Your next appointment:   12 month(s)  The format for your next appointment:   In Person  Provider:   Lance Muss, MD     Other Instructions    Important Information About Sugar

## 2022-01-13 ENCOUNTER — Other Ambulatory Visit: Payer: Self-pay | Admitting: *Deleted

## 2022-01-13 DIAGNOSIS — E782 Mixed hyperlipidemia: Secondary | ICD-10-CM

## 2022-01-13 NOTE — Progress Notes (Signed)
Lipid clinic referral 

## 2022-01-14 ENCOUNTER — Other Ambulatory Visit: Payer: Self-pay | Admitting: Interventional Cardiology

## 2022-01-14 DIAGNOSIS — I10 Essential (primary) hypertension: Secondary | ICD-10-CM

## 2022-01-14 DIAGNOSIS — I2510 Atherosclerotic heart disease of native coronary artery without angina pectoris: Secondary | ICD-10-CM

## 2022-02-01 DIAGNOSIS — E7800 Pure hypercholesterolemia, unspecified: Secondary | ICD-10-CM | POA: Diagnosis not present

## 2022-02-01 DIAGNOSIS — Z125 Encounter for screening for malignant neoplasm of prostate: Secondary | ICD-10-CM | POA: Diagnosis not present

## 2022-02-01 DIAGNOSIS — E1159 Type 2 diabetes mellitus with other circulatory complications: Secondary | ICD-10-CM | POA: Diagnosis not present

## 2022-02-01 DIAGNOSIS — M109 Gout, unspecified: Secondary | ICD-10-CM | POA: Diagnosis not present

## 2022-02-01 DIAGNOSIS — I10 Essential (primary) hypertension: Secondary | ICD-10-CM | POA: Diagnosis not present

## 2022-02-05 ENCOUNTER — Other Ambulatory Visit: Payer: Self-pay | Admitting: Interventional Cardiology

## 2022-02-05 DIAGNOSIS — I10 Essential (primary) hypertension: Secondary | ICD-10-CM

## 2022-02-05 DIAGNOSIS — I2510 Atherosclerotic heart disease of native coronary artery without angina pectoris: Secondary | ICD-10-CM

## 2022-02-06 NOTE — Progress Notes (Unsigned)
Patient ID: Timothy [name].                 DOB: 1971-03-26                    MRN: [id number]     HPI: Timothy [name]. is a 51 y.o. male patient of Dr. Eldridge Dace referred to lipid clinic by Irena Reichmann, DO. PMH is significant for HTN, HLD, CAD, T2DM. Pt has STEMI in 05/21/2019 with mid LAD 50% stenosed, mid RCA 25% stenosed and OM2 99% stenosed s/p angioplasty and DES to OM2.   Discuss repatha  BCBS  Current Medications: atorvastatin 80 mg daily Intolerances: none Risk Factors: CAD, HLD, HTN, T2DM LDL goal: <55   Diet:   Exercise:   Family History: mother -DM   Social History: former smoker, no alcohol use  Labs: 01/10/22 - TC 144, TG 117, HDL 37, LDL 86   Past Medical History:  Diagnosis Date   Acute MI, lateral wall (HCC) 05/21/2019   Coronary artery disease    Hyperlipidemia    Hypertension    S/P angioplasty with stent to OM2  DES 05/21/2019   STEMI (ST elevation myocardial infarction) (HCC) 05/21/2019   Tobacco use     Current Outpatient Medications on File Prior to Visit  Medication Sig Dispense Refill   acetaminophen (TYLENOL) 325 MG tablet Take 650 mg by mouth every 6 (six) hours as needed for mild pain, fever or headache.     allopurinol (ZYLOPRIM) 300 MG tablet Take 300 mg by mouth daily.      atorvastatin (LIPITOR) 80 MG tablet Take 1 tablet (80 mg total) by mouth daily. 90 tablet 3   calcium carbonate (TUMS - DOSED IN MG ELEMENTAL CALCIUM) 500 MG chewable tablet Chew 1,000-1,500 tablets by mouth 3 (three) times daily as needed for indigestion or heartburn.     clopidogrel (PLAVIX) 75 MG tablet TAKE 1 TABLET (75 MG TOTAL) BY MOUTH DAILY. SCHEDULE AN APPOINTMENT FOR FURTHER REFILLS, 1ST ATTEMPT 60 tablet 2   Empagliflozin-metFORMIN HCl ER (SYNJARDY XR) 12.10-998 MG TB24 Take 1 tablet by mouth daily.     famotidine (PEPCID) 20 MG tablet TAKE 1 TABLET BY MOUTH EVERY DAY (Patient taking differently: Take 20 mg by mouth daily.) 90 tablet 1   lisinopril (ZESTRIL) 2.5 MG  tablet Take 1 tablet (2.5 mg total) by mouth daily. 90 tablet 1   metoprolol tartrate (LOPRESSOR) 25 MG tablet TAKE 1 TABLET BY MOUTH TWICE A DAY 180 tablet 1   nitroGLYCERIN (NITROSTAT) 0.4 MG SL tablet Place 1 tablet (0.4 mg total) under the tongue every 5 (five) minutes as needed. (Patient taking differently: Place 0.4 mg under the tongue every 5 (five) minutes as needed for chest pain.) 25 tablet 2   OZEMPIC, 0.25 OR 0.5 MG/DOSE, 2 MG/1.5ML SOPN Inject 0.5 mg into the skin once a week. (Patient not taking: Reported on 01/10/2022)     No current facility-administered medications on file prior to visit.    Allergies  Allergen Reactions   Morphine And Related Hives   Bee Venom     Assessment/Plan:  1. Hyperlipidemia -    Thank you,   Olene Floss, Pharm.D, BCPS, CPP [facility]  [address] [place], [place] [address]  Phone: [phone]; Fax: [phone]

## 2022-02-07 ENCOUNTER — Ambulatory Visit: Payer: BC Managed Care – PPO | Attending: Cardiovascular Disease | Admitting: Pharmacist

## 2022-02-07 DIAGNOSIS — E782 Mixed hyperlipidemia: Secondary | ICD-10-CM | POA: Diagnosis not present

## 2022-02-07 MED ORDER — EZETIMIBE 10 MG PO TABS: 10.0000 mg | ORAL_TABLET | Freq: Every day | ORAL | 3 refills | Status: DC

## 2022-02-07 NOTE — Patient Instructions (Addendum)
Your LDL is 86. We would like it to be <55.   Start taking ezetimibe 10 mg daily.  Continue taking atorvastatin 80 mg daily.  We will recheck fasting lipid labs in 2 months on 10/23.   Continue walking every day and try to add resistance training.  Try to drink more water instead of soda, starting with half a can a day.

## 2022-03-23 ENCOUNTER — Other Ambulatory Visit: Payer: Self-pay | Admitting: Interventional Cardiology

## 2022-04-04 ENCOUNTER — Other Ambulatory Visit: Payer: BC Managed Care – PPO

## 2022-04-05 ENCOUNTER — Other Ambulatory Visit: Payer: Self-pay | Admitting: Interventional Cardiology

## 2022-08-17 DIAGNOSIS — E1159 Type 2 diabetes mellitus with other circulatory complications: Secondary | ICD-10-CM | POA: Diagnosis not present

## 2022-08-17 DIAGNOSIS — M109 Gout, unspecified: Secondary | ICD-10-CM | POA: Diagnosis not present

## 2022-08-17 DIAGNOSIS — E7800 Pure hypercholesterolemia, unspecified: Secondary | ICD-10-CM | POA: Diagnosis not present

## 2022-08-22 DIAGNOSIS — Z955 Presence of coronary angioplasty implant and graft: Secondary | ICD-10-CM | POA: Diagnosis not present

## 2022-08-22 DIAGNOSIS — Z0000 Encounter for general adult medical examination without abnormal findings: Secondary | ICD-10-CM | POA: Diagnosis not present

## 2022-08-22 DIAGNOSIS — E1159 Type 2 diabetes mellitus with other circulatory complications: Secondary | ICD-10-CM | POA: Diagnosis not present

## 2022-08-22 DIAGNOSIS — M109 Gout, unspecified: Secondary | ICD-10-CM | POA: Diagnosis not present

## 2022-08-22 DIAGNOSIS — M549 Dorsalgia, unspecified: Secondary | ICD-10-CM | POA: Diagnosis not present

## 2022-09-15 DIAGNOSIS — Z0000 Encounter for general adult medical examination without abnormal findings: Secondary | ICD-10-CM | POA: Diagnosis not present

## 2022-09-15 DIAGNOSIS — E1159 Type 2 diabetes mellitus with other circulatory complications: Secondary | ICD-10-CM | POA: Diagnosis not present

## 2022-09-15 DIAGNOSIS — Z955 Presence of coronary angioplasty implant and graft: Secondary | ICD-10-CM | POA: Diagnosis not present

## 2022-09-15 DIAGNOSIS — Z1211 Encounter for screening for malignant neoplasm of colon: Secondary | ICD-10-CM | POA: Diagnosis not present

## 2022-10-14 IMAGING — MR MR HEAD W/O CM
6 of 10 series · 29 of 48 positions shown · non-contrast
Comparison: Head CT 01/03/2021

CLINICAL DATA: Left arm numbness.

EXAM:
MRI HEAD WITHOUT CONTRAST
TECHNIQUE: Multiplanar, multiecho pulse sequences of the brain and surrounding
structures were obtained without intravenous contrast.

[Series 2: DWI · axial · 3.0mm · 0.94mm/px · z∈[-45,+105]mm · 9 of 102 slices shown (1 of 2)]
[im 1/102]
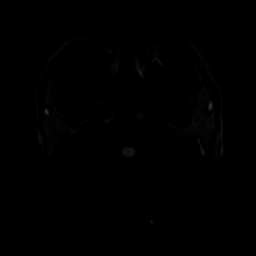
[im 13/102]
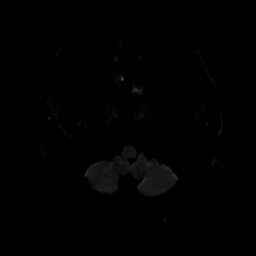
[im 26/102]
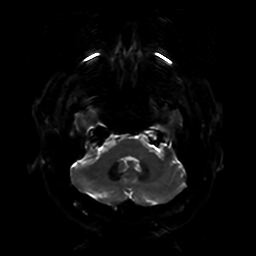
[im 38/102]
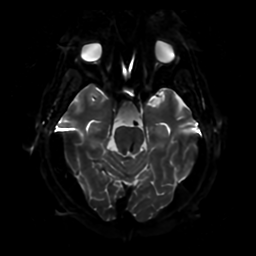
[im 51/102]
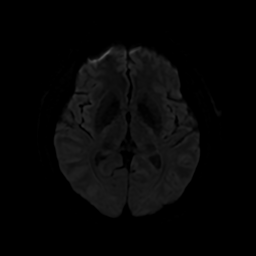
[im 64/102]
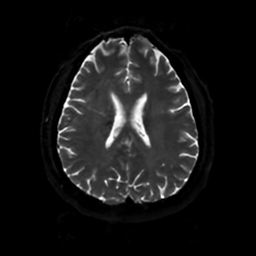
[im 76/102]
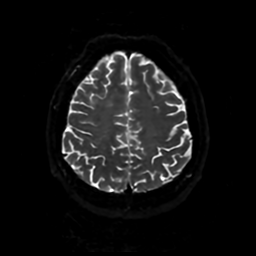
[im 89/102]
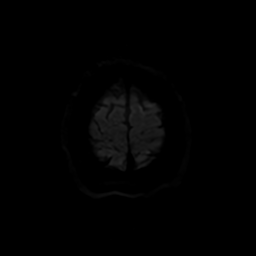
[im 102/102]
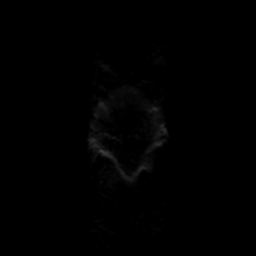

[Series 3: DWI · coronal · 4.0mm · 0.94mm/px · 7 of 74 slices shown (2 of 2)]
[im 1/74]
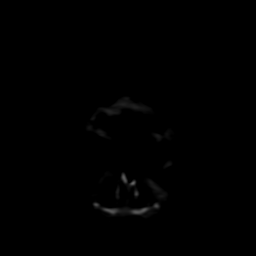
[im 13/74]
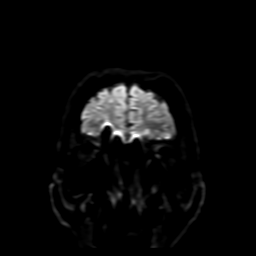
[im 25/74]
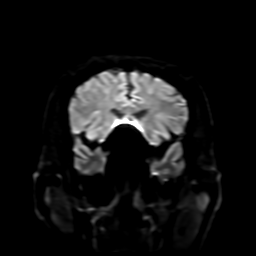
[im 37/74]
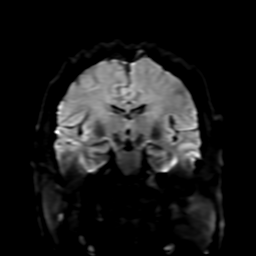
[im 49/74]
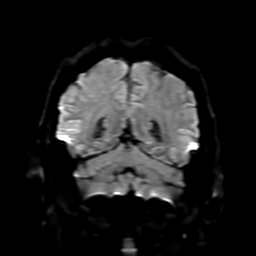
[im 61/74]
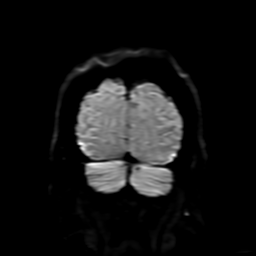
[im 74/74]
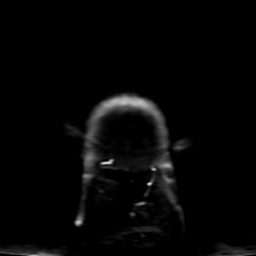

[Series 4: FLAIR · sagittal · 5.0mm · 0.23mm/px · 2 of 25 slices shown (1 of 2)]
[im 1/25]
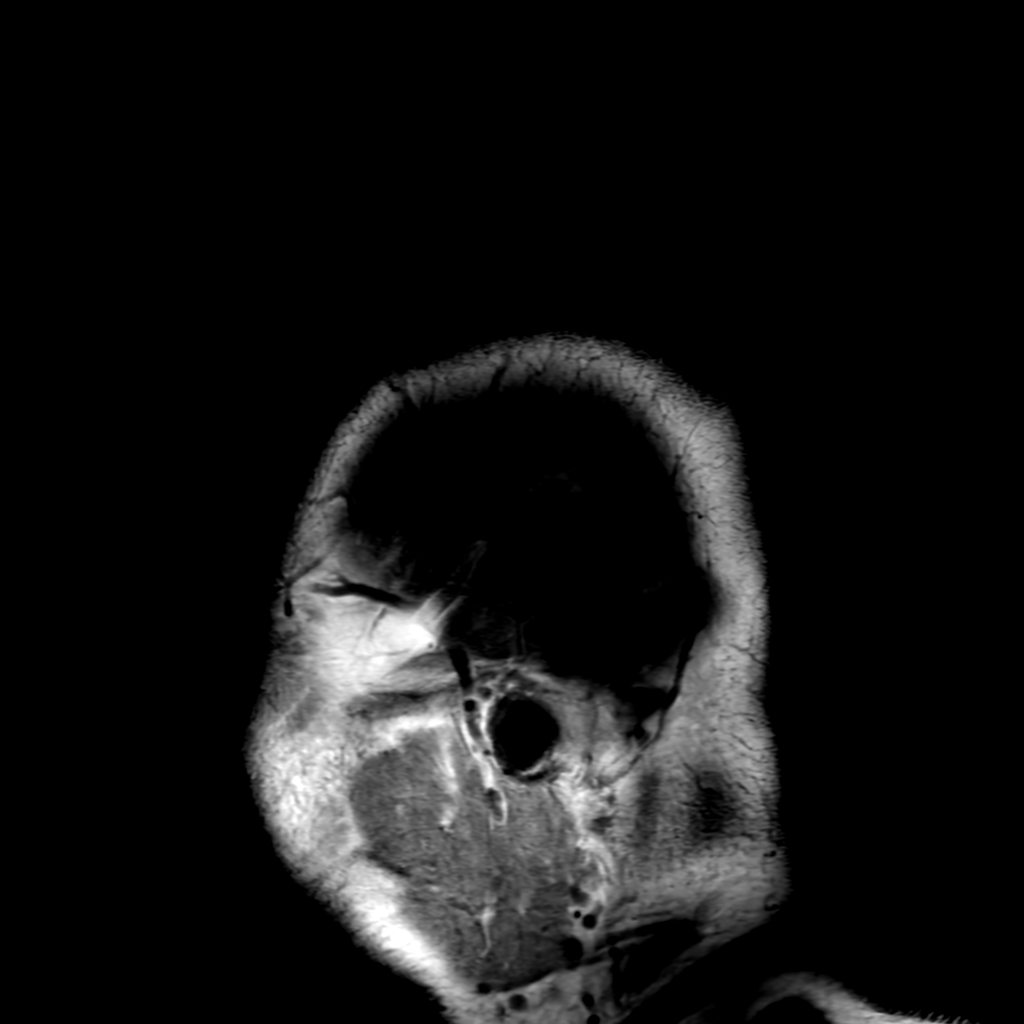
[im 25/25]
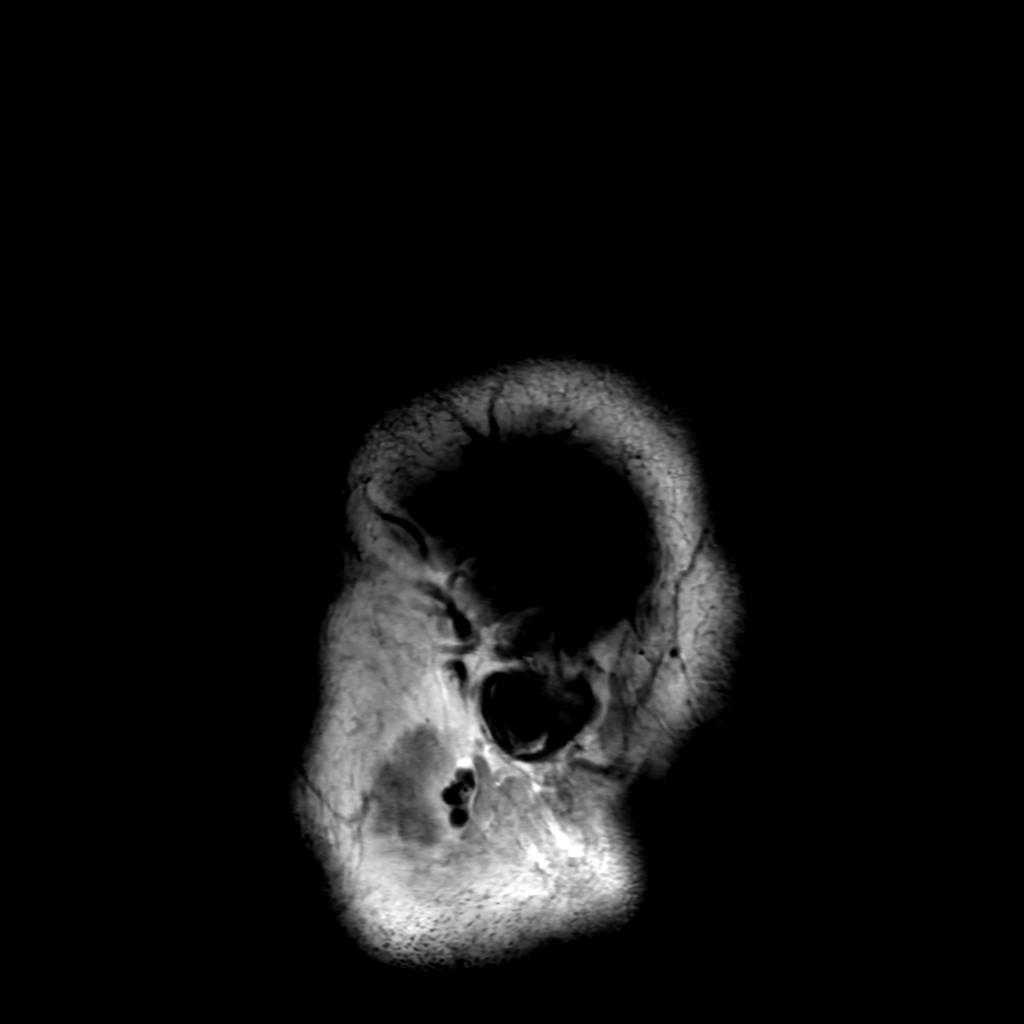

[Series 6: FLAIR · axial · 4.0mm · 0.45mm/px · z∈[-45,+104]mm · 3 of 35 slices shown (2 of 2)]
[im 1/35]
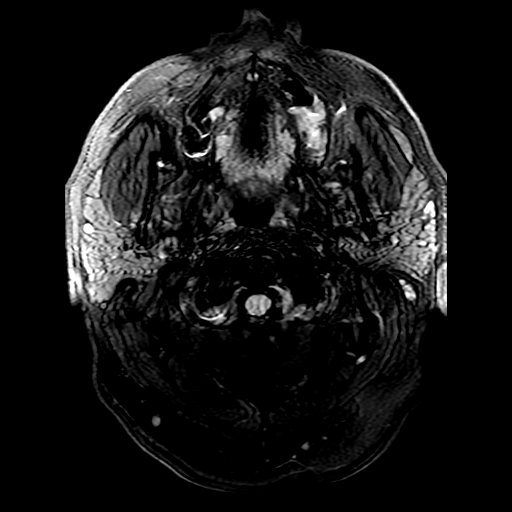
[im 18/35]
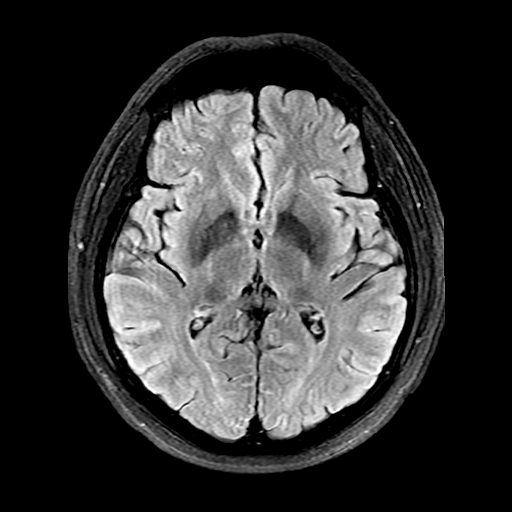
[im 35/35]
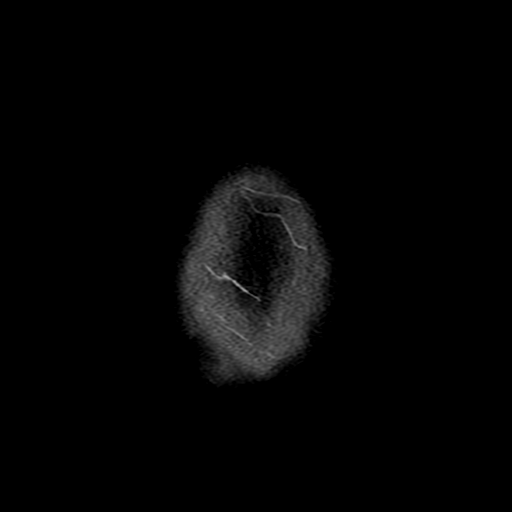

[Series 250: ADC · axial · 3.0mm · 0.94mm/px · z∈[-45,+105]mm · 5 of 51 slices shown (1 of 2)]
[im 1/51]
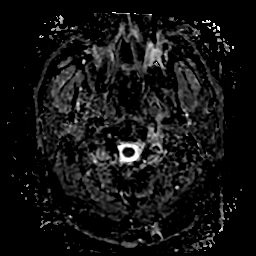
[im 13/51]
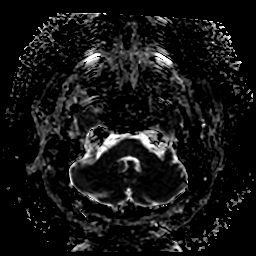
[im 26/51]
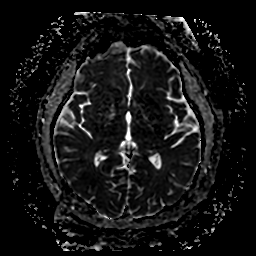
[im 38/51]
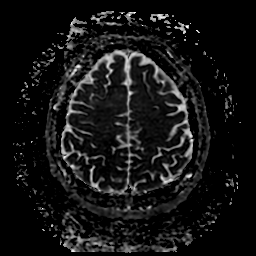
[im 51/51]
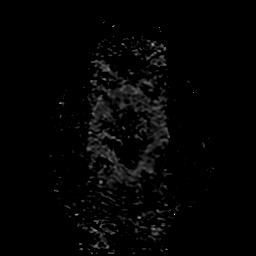

[Series 350: ADC · coronal · 4.0mm · 0.94mm/px · 3 of 37 slices shown (2 of 2)]
[im 1/37]
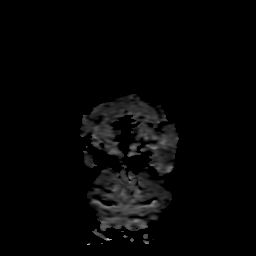
[im 19/37]
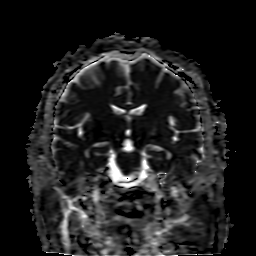
[im 37/37]
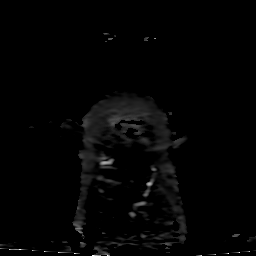

[29 of 48 positions shown; findings below may reference images not displayed]

FINDINGS: Brain: There is no evidence of an acute infarct, intracranial
hemorrhage, mass, midline shift, or extra-axial fluid collection.
The ventricles and sulci are normal. No significant white matter
disease is evident.

Vascular: Major intracranial vascular flow voids are preserved.

Skull and upper cervical spine: Unremarkable bone marrow signal.

Sinuses/Orbits: Unremarkable orbits. Mild mucosal thickening in the
left maxillary sinus. Trapped fluid in the left petrous apex.

Other: None.
IMPRESSION: Unremarkable appearance of the brain.

## 2022-11-04 ENCOUNTER — Other Ambulatory Visit: Payer: Self-pay | Admitting: Interventional Cardiology

## 2023-01-23 ENCOUNTER — Other Ambulatory Visit: Payer: Self-pay

## 2023-01-23 ENCOUNTER — Emergency Department (HOSPITAL_BASED_OUTPATIENT_CLINIC_OR_DEPARTMENT_OTHER): Payer: PRIVATE HEALTH INSURANCE

## 2023-01-23 ENCOUNTER — Observation Stay (HOSPITAL_BASED_OUTPATIENT_CLINIC_OR_DEPARTMENT_OTHER)
Admission: EM | Admit: 2023-01-23 | Discharge: 2023-01-25 | Disposition: A | Discharge status: Home / Self Care | Payer: PRIVATE HEALTH INSURANCE | Attending: Emergency Medicine | Admitting: Emergency Medicine

## 2023-01-23 ENCOUNTER — Encounter (HOSPITAL_BASED_OUTPATIENT_CLINIC_OR_DEPARTMENT_OTHER): Payer: Self-pay

## 2023-01-23 DIAGNOSIS — Z87891 Personal history of nicotine dependence: Secondary | ICD-10-CM | POA: Diagnosis not present

## 2023-01-23 DIAGNOSIS — Z7902 Long term (current) use of antithrombotics/antiplatelets: Secondary | ICD-10-CM | POA: Insufficient documentation

## 2023-01-23 DIAGNOSIS — I10 Essential (primary) hypertension: Secondary | ICD-10-CM | POA: Diagnosis not present

## 2023-01-23 DIAGNOSIS — I2510 Atherosclerotic heart disease of native coronary artery without angina pectoris: Secondary | ICD-10-CM

## 2023-01-23 DIAGNOSIS — E119 Type 2 diabetes mellitus without complications: Secondary | ICD-10-CM | POA: Diagnosis not present

## 2023-01-23 DIAGNOSIS — R911 Solitary pulmonary nodule: Secondary | ICD-10-CM | POA: Diagnosis not present

## 2023-01-23 DIAGNOSIS — R079 Chest pain, unspecified: Principal | ICD-10-CM | POA: Diagnosis present

## 2023-01-23 DIAGNOSIS — J0100 Acute maxillary sinusitis, unspecified: Secondary | ICD-10-CM | POA: Insufficient documentation

## 2023-01-23 DIAGNOSIS — I25110 Atherosclerotic heart disease of native coronary artery with unstable angina pectoris: Secondary | ICD-10-CM | POA: Insufficient documentation

## 2023-01-23 DIAGNOSIS — R202 Paresthesia of skin: Secondary | ICD-10-CM | POA: Insufficient documentation

## 2023-01-23 DIAGNOSIS — Z79899 Other long term (current) drug therapy: Secondary | ICD-10-CM | POA: Diagnosis not present

## 2023-01-23 DIAGNOSIS — Z955 Presence of coronary angioplasty implant and graft: Secondary | ICD-10-CM | POA: Diagnosis not present

## 2023-01-23 DIAGNOSIS — E785 Hyperlipidemia, unspecified: Secondary | ICD-10-CM | POA: Diagnosis present

## 2023-01-23 DIAGNOSIS — R519 Headache, unspecified: Secondary | ICD-10-CM

## 2023-01-23 LAB — COMPREHENSIVE METABOLIC PANEL
ALT: 30 U/L (ref 0–44)
AST: 24 U/L (ref 15–41)
Albumin: 3.7 g/dL (ref 3.5–5.0)
Alkaline Phosphatase: 84 U/L (ref 38–126)
Anion gap: 8 (ref 5–15)
BUN: 7 mg/dL (ref 6–20)
CO2: 23 mmol/L (ref 22–32)
Calcium: 8.9 mg/dL (ref 8.9–10.3)
Chloride: 104 mmol/L (ref 98–111)
Creatinine, Ser: 0.98 mg/dL (ref 0.61–1.24)
GFR, Estimated: >60 mL/min (ref >60)
Glucose, Bld: 171 mg/dL — ABNORMAL HIGH (ref 70–99)
Potassium: 4.1 mmol/L (ref 3.5–5.1)
Sodium: 135 mmol/L (ref 135–145)
Total Bilirubin: 0.8 mg/dL (ref 0.3–1.2)
Total Protein: 6.6 g/dL (ref 6.5–8.1)

## 2023-01-23 LAB — CBC
HCT: 45.4 % (ref 39.0–52.0)
Hemoglobin: 16.1 g/dL (ref 13.0–17.0)
MCH: 29.3 pg (ref 26.0–34.0)
MCHC: 35.5 g/dL (ref 30.0–36.0)
MCV: 82.7 fL (ref 80.0–100.0)
Platelets: 306 10*3/uL (ref 150–400)
RBC: 5.49 MIL/uL (ref 4.22–5.81)
RDW: 12.1 % (ref 11.5–15.5)
WBC: 9.8 10*3/uL (ref 4.0–10.5)
nRBC: 0 % (ref 0.0–0.2)

## 2023-01-23 LAB — TROPONIN I (HIGH SENSITIVITY)
Troponin I (High Sensitivity): 2 ng/L (ref <18)
Troponin I (High Sensitivity): 2 ng/L (ref <18)

## 2023-01-23 MED ORDER — NITROGLYCERIN 0.4 MG SL SUBL
0.4000 mg | SUBLINGUAL_TABLET | Freq: Once | SUBLINGUAL | Status: AC
  Administered 2023-01-23: 0.4 mg via SUBLINGUAL
  Filled 2023-01-23: qty 1

## 2023-01-23 MED ORDER — AMOXICILLIN-POT CLAVULANATE 875-125 MG PO TABS
1.0000 | ORAL_TABLET | Freq: Once | ORAL | Status: AC
  Administered 2023-01-23: 1 via ORAL
  Filled 2023-01-23: qty 1

## 2023-01-23 MED ORDER — ASPIRIN 81 MG PO CHEW
324.0000 mg | CHEWABLE_TABLET | Freq: Once | ORAL | Status: AC
  Administered 2023-01-23: 324 mg via ORAL
  Filled 2023-01-23: qty 4

## 2023-01-23 MED ORDER — METOCLOPRAMIDE HCL 5 MG/ML IJ SOLN
10.0000 mg | Freq: Once | INTRAMUSCULAR | Status: AC
  Administered 2023-01-23: 10 mg via INTRAVENOUS
  Filled 2023-01-23: qty 2

## 2023-01-23 MED ORDER — SODIUM CHLORIDE 0.9 % IV BOLUS
1000.0000 mL | Freq: Once | INTRAVENOUS | Status: AC
  Administered 2023-01-23: 1000 mL via INTRAVENOUS

## 2023-01-23 MED ORDER — DEXAMETHASONE SODIUM PHOSPHATE 10 MG/ML IJ SOLN
10.0000 mg | Freq: Once | INTRAMUSCULAR | Status: AC
  Administered 2023-01-23: 10 mg via INTRAVENOUS
  Filled 2023-01-23: qty 1

## 2023-01-23 MED ORDER — DIPHENHYDRAMINE HCL 50 MG/ML IJ SOLN
25.0000 mg | Freq: Once | INTRAMUSCULAR | Status: AC
  Administered 2023-01-23: 25 mg via INTRAVENOUS
  Filled 2023-01-23 (×2): qty 1

## 2023-01-23 MED ORDER — IOHEXOL 350 MG/ML SOLN
125.0000 mL | Freq: Once | INTRAVENOUS | Status: AC | PRN
  Administered 2023-01-23: 125 mL via INTRAVENOUS

## 2023-01-23 NOTE — ED Notes (Signed)
X-ray at bedside

## 2023-01-23 NOTE — ED Notes (Signed)
ED Provider at bedside. 

## 2023-01-23 NOTE — ED Notes (Signed)
Pt provided microwave meal and ice cream, EDP aware and agreeable. Pt with no further requests. Call bell within reach, will continue to monitor.

## 2023-01-23 NOTE — ED Triage Notes (Signed)
Pt reports:  Chest tightness Started 1:30pm today Numbness left arm Started 1:30pm today Headache Started 2pm today Metal Taste in Mouth Started 2pm today

## 2023-01-23 NOTE — ED Provider Notes (Signed)
I provided a substantive portion of the care of this patient.  I personally made/approved the management plan for this patient and take responsibility for the patient management.  EKG Interpretation Date/Time:  Monday January 23 2023 14:38:29 EDT Ventricular Rate:  78 PR Interval:  155 QRS Duration:  95 QT Interval:  390 QTC Calculation: 445 R Axis:   2  Text Interpretation: Sinus rhythm no acute ischemic appearance. no sig change from previous Confirmed by Arby Barrette [phone]) on 01/23/2023 5:36:05 PM   Patient has sudden and tense pressure in his left chest with radiation to the left arm and a feeling of intense numbness and heaviness into the arm.  This is similar to prior ischemic event in 2020 resulting in stent placement.  Patient got relief of pain with nitroglycerin in the emergency department.  Patient is alert with clear mental status.  No respiratory distress at rest.  Vital signs are stable at this time.  Monitor shows normal sinus rhythm in the 70s.  I have reviewed EKG no acute ischemic appearance at this time.  Patient has high risk for cardiac ischemia with concerning history for acute event.  Troponins are negative at this time.  Will plan for cardiology consultation.   Arby Barrette, MD 01/23/23 [phone]

## 2023-01-23 NOTE — ED Provider Notes (Signed)
[facility] Provider Note   CSN: [id number] Arrival date & time: 01/23/23  1415     History  Chief Complaint  Patient presents with   Chest Pain   Numbness   Headache    Timothy [name]. is a 52 y.o. male, history of MI, with 1 stent, who presents to the ED secondary to this chest tightness, and left arm numbness, that began at 1:30 PM today, as well as a severe headache, that came on at 2 PM today with the metallic like taste in mouth.  He states 130 today, all of a sudden he started having chest maintenance, that was about a 7 out of 10, with radiating left arm numbness.  He states that his arms were not weak, he does not have any vision changes, no nausea, or vomiting, but just states that it feels like someone sat on his arm.  States it does feel similar to his heart attack in 2020.  Has not taken any nitroglycerin.  Additionally states that he developed a severe sudden headache, around 2 PM today, that is global, with associated nausea, but no vomiting.  He does not have any vision changes, has not had a headache like this before.  States this just very uncomfortable and difficult to think.  He also developed a metallic taste in his mouth, with a severe headache.  So he decided come to the ER.  Denies any head trauma, weakness in the arms or legs or dizziness.    Last cath 2020 Dr. Varanasi=cardiologist Home Medications  Prior to Admission medications   Medication Sig Start Date End Date Taking? Authorizing Provider  acetaminophen (TYLENOL) 325 MG tablet Take 650 mg by mouth every 6 (six) hours as needed for mild pain, fever or headache.    [provider]  allopurinol (ZYLOPRIM) 300 MG tablet Take 300 mg by mouth daily.  07/06/18   [provider]  atorvastatin (LIPITOR) 80 MG tablet Take 1 tablet (80 mg total) by mouth daily. 01/14/22   Corky Crafts, MD  calcium carbonate (TUMS - DOSED IN MG ELEMENTAL CALCIUM) 500  MG chewable tablet Chew 1,000-1,500 tablets by mouth 3 (three) times daily as needed for indigestion or heartburn.    [provider]  clopidogrel (PLAVIX) 75 MG tablet TAKE 1 TABLET BY MOUTH EVERY DAY 11/04/22   Corky Crafts, MD  Empagliflozin-metFORMIN HCl ER (SYNJARDY XR) 12.10-998 MG TB24 Take 1 tablet by mouth daily. 09/10/21   [provider]  ezetimibe (ZETIA) 10 MG tablet Take 1 tablet (10 mg total) by mouth daily. 02/07/22   Corky Crafts, MD  famotidine (PEPCID) 20 MG tablet TAKE 1 TABLET BY MOUTH EVERY DAY Patient taking differently: Take 20 mg by mouth daily. 03/31/20   Leone Brand, NP  lisinopril (ZESTRIL) 2.5 MG tablet TAKE 1 TABLET BY MOUTH EVERY DAY 02/07/22   Corky Crafts, MD  metoprolol tartrate (LOPRESSOR) 25 MG tablet TAKE 1 TABLET BY MOUTH TWICE A DAY 02/07/22   Corky Crafts, MD  nitroGLYCERIN (NITROSTAT) 0.4 MG SL tablet Place 1 tablet (0.4 mg total) under the tongue every 5 (five) minutes as needed. Patient taking differently: Place 0.4 mg under the tongue every 5 (five) minutes as needed for chest pain. 05/23/19   Arty Baumgartner, NP  OZEMPIC, 0.25 OR 0.5 MG/DOSE, 2 MG/1.5ML SOPN Inject 0.5 mg into the skin once a week. Patient not taking: Reported on 01/10/2022 05/12/20  [provider]      Allergies    Morphine and codeine and Bee venom    Review of Systems   Review of Systems  Cardiovascular:  Positive for chest pain.  Neurological:  Positive for headaches.    Physical Exam Updated Vital Signs BP 112/82   Pulse 71   Temp 98.4 F (36.9 C) (Oral)   Resp 19   Ht 5\' 10"  (1.778 m)   Wt 113.4 kg   SpO2 95%   BMI 35.87 kg/m  Physical Exam Vitals and nursing note reviewed.  Constitutional:      General: He is not in acute distress.    Appearance: He is well-developed.  HENT:     Head: Normocephalic and atraumatic.  Eyes:     Conjunctiva/sclera: Conjunctivae normal.  Cardiovascular:     Rate  and Rhythm: Normal rate and regular rhythm.     Heart sounds: No murmur heard. Pulmonary:     Effort: Pulmonary effort is normal. No respiratory distress.     Breath sounds: Normal breath sounds.  Abdominal:     Palpations: Abdomen is soft.     Tenderness: There is no abdominal tenderness.  Musculoskeletal:        General: No swelling.     Cervical back: Neck supple.  Skin:    General: Skin is warm and dry.     Capillary Refill: Capillary refill takes less than 2 seconds.  Neurological:     General: No focal deficit present.     Mental Status: He is alert.     Comments: No focal deficits, strength of bilateral upper and lower extremities 5 out of 5.  No decrease sensation in the left upper extremity, right upper extremity.  Cranial nerves intact  Psychiatric:        Mood and Affect: Mood normal.     ED Results / Procedures / Treatments   Labs (all labs ordered are listed, but only abnormal results are displayed) Labs Reviewed  COMPREHENSIVE METABOLIC PANEL - Abnormal; Notable for the following components:      Result Value   Glucose, Bld 171 (*)    All other components within normal limits  CBC  TROPONIN I (HIGH SENSITIVITY)  TROPONIN I (HIGH SENSITIVITY)    EKG EKG Interpretation Date/Time:  Monday January 23 2023 14:38:29 EDT Ventricular Rate:  78 PR Interval:  155 QRS Duration:  95 QT Interval:  390 QTC Calculation: 445 R Axis:   2  Text Interpretation: Sinus rhythm no acute ischemic appearance. no sig change from previous Confirmed by Arby Barrette [phone]) on 01/23/2023 5:36:05 PM  Radiology CT Angio Chest/Abd/Pel for Dissection W and/or Wo Contrast  Result Date: 01/23/2023 CLINICAL DATA:  Left chest pain and left arm pain EXAM: CT ANGIOGRAPHY CHEST, ABDOMEN AND PELVIS TECHNIQUE: Non-contrast CT of the chest was initially obtained. Multidetector CT imaging through the chest, abdomen and pelvis was performed using the standard protocol during bolus administration  of intravenous contrast. Multiplanar reconstructed images and MIPs were obtained and reviewed to evaluate the vascular anatomy. RADIATION DOSE REDUCTION: This exam was performed according to the departmental dose-optimization program which includes automated exposure control, adjustment of the mA and/or kV according to patient size and/or use of iterative reconstruction technique. CONTRAST:  OMNIPAQUE IOHEXOL 350 MG/ML SOLN COMPARISON:  None Available. FINDINGS: CTA CHEST FINDINGS Cardiovascular: Normal heart size. No pericardial effusion. No evidence of pulmonary embolus. Mild coronary artery calcifications. Mediastinum/Nodes: Esophagus and thyroid are unremarkable. No enlarged lymph  nodes seen in the chest. Lungs/Pleura: Central airways are patent. No consolidation, pleural effusion or pneumothorax. Scattered bilateral solid pulmonary nodules, largest is a 6 mm nodule of the left lower lobe located on series 6, image 33. Musculoskeletal: No aggressive appearing osseous lesions. Review of the MIP images confirms the above findings. CTA ABDOMEN AND PELVIS FINDINGS VASCULAR Aorta: Normal caliber aorta without aneurysm, dissection, vasculitis or significant stenosis. Standard three-vessel aortic arch with no significant stenosis. Celiac: Patent without evidence of dissection or vasculitis. Moderate narrowing at the origin due to diaphragmatic compression. SMA: Patent without evidence of aneurysm, dissection, vasculitis or significant stenosis. Renals: Both renal arteries are patent without evidence of aneurysm, dissection, vasculitis, fibromuscular dysplasia or significant stenosis. IMA: Patent without evidence of aneurysm, dissection, vasculitis or significant stenosis. Inflow: Patent without evidence of aneurysm, dissection, vasculitis or significant stenosis. Veins: No obvious venous abnormality within the limitations of this arterial phase study. Review of the MIP images confirms the above findings.  NON-VASCULAR Hepatobiliary: Tiny focal area of arterial hyperenhancement measuring 5 mm on series 5, image 91, likely a flash fill hemangioma or perfusional. No suspicious focal liver abnormality is seen. No gallstones, gallbladder wall thickening, or biliary dilatation. Pancreas: Unremarkable. No pancreatic ductal dilatation or surrounding inflammatory changes. Spleen: Normal in size without focal abnormality. Adrenals/Urinary Tract: Bilateral adrenal glands are unremarkable. No hydronephrosis or nephrolithiasis. Bladder is decompressed. Stomach/Bowel: Stomach is within normal limits. Appendix appears normal. No evidence of bowel wall thickening, distention, or inflammatory changes. Lymphatic: No enlarged lymph nodes seen in the chest. Reproductive: Prostate is unremarkable. Other: Blayre Papania bilateral fat containing inguinal hernias. Musculoskeletal: No acute or significant osseous findings. Review of the MIP images confirms the above findings. IMPRESSION: 1. Normal caliber aorta with no evidence of acute aortic syndrome. 2. No evidence of pulmonary embolus. 3. No acute findings in the chest, abdomen or pelvis. 4. Scattered bilateral solid pulmonary nodules, largest is a 6 mm nodule of the left lower lobe. Non-contrast chest CT at 3-6 months is recommended. If the nodules are stable at time of repeat CT, then future CT at 18-24 months (from today's scan) is considered optional for low-risk patients, but is recommended for high-risk patients. This recommendation follows the consensus statement: Guidelines for Management of Incidental Pulmonary Nodules Detected on CT Images: From the Fleischner Society 2017; Radiology 2017; 284:228-243. 5. Mild coronary artery calcifications and aortic Atherosclerosis (ICD10-I70.0). Electronically Signed   By: Allegra Lai M.D.   On: 01/23/2023 16:34   CT Head Wo Contrast  Result Date: 01/23/2023 CLINICAL DATA:  Provided history: Headache, sudden, severe. Left arm numbness.  Headache. EXAM: CT HEAD WITHOUT CONTRAST TECHNIQUE: Contiguous axial images were obtained from the base of the skull through the vertex without intravenous contrast. RADIATION DOSE REDUCTION: This exam was performed according to the departmental dose-optimization program which includes automated exposure control, adjustment of the mA and/or kV according to patient size and/or use of iterative reconstruction technique. COMPARISON:  Brain MRI 01/03/2021.  Head CT 01/03/2021. FINDINGS: Brain: Cerebral volume is normal. There is no acute intracranial hemorrhage. No demarcated cortical infarct. No extra-axial fluid collection. No evidence of an intracranial mass. No midline shift. Vascular: No hyperdense vessel.  Atherosclerotic calcifications. Skull: No calvarial fracture or aggressive osseous lesion. Sinuses/Orbits: No mass or acute finding within the imaged orbits. Minimal mucosal thickening within the right maxillary sinus. 2.1 cm mucous retention cyst, and mild background mucosal thickening, within the left maxillary sinus. Other: There is periapical lucency surrounding the left maxillary second  molar tooth. IMPRESSION: 1.  No evidence of an acute intracranial abnormality. 2. Paranasal sinus disease as described. There is periapical lucency surrounding the left maxillary second molar tooth, and the left maxillary sinus findings could reflect odontogenic sinusitis. Electronically Signed   By: Jackey Loge D.O.   On: 01/23/2023 16:33   DG Chest Portable 1 View  Result Date: 01/23/2023 CLINICAL DATA:  Chest pain EXAM: PORTABLE CHEST 1 VIEW COMPARISON:  01/03/2021 FINDINGS: No consolidation, pneumothorax or effusion. No edema. Normal cardiopericardial silhouette. Degenerative changes are seen along the spine. Film is under penetrated. The extreme right inferior costophrenic angle is clipped off the edge of the film. IMPRESSION: No acute cardiopulmonary disease Electronically Signed   By: Karen Kays M.D.   On:  01/23/2023 15:20    Procedures Procedures    Medications Ordered in ED Medications  amoxicillin-clavulanate (AUGMENTIN) 875-125 MG per tablet 1 tablet (has no administration in time range)  nitroGLYCERIN (NITROSTAT) SL tablet 0.4 mg (0.4 mg Sublingual Given 01/23/23 1509)  sodium chloride 0.9 % bolus 1,000 mL (0 mLs Intravenous Stopped 01/23/23 1650)  metoCLOPramide (REGLAN) injection 10 mg (10 mg Intravenous Given 01/23/23 1519)  diphenhydrAMINE (BENADRYL) injection 25 mg (25 mg Intravenous Given 01/23/23 1516)  dexamethasone (DECADRON) injection 10 mg (10 mg Intravenous Given 01/23/23 1515)  aspirin chewable tablet 324 mg (324 mg Oral Given 01/23/23 1510)  iohexol (OMNIPAQUE) 350 MG/ML injection 125 mL (125 mLs Intravenous Contrast Given 01/23/23 1548)    ED Course/ Medical Decision Making/ A&P             HEART Score: 6                    Medical Decision Making Patient is a 52 year old male, here for chest pain, headache, has been going on since 130 and 2 PM today.  He feels he feels some numbness in his left arm, but no other issues.  No sensory deficits on exam.  We will obtain a CTA dissection study, for further evaluation, given his neurosymptoms, as well as chest pain.  We also obtain troponins, blood work for further evaluation.  Amount and/or Complexity of Data Reviewed Labs: ordered.    Details: Troponin x 2 within normal limits Radiology: ordered.    Details: CT shows possible odontogenic sinus infection, CT head she has no acute evidence of any bleed, CTA dissection study, negative for dissection, found to have incidental pulmonary nodules that will need follow-up ECG/medicine tests:  Decision-making details documented in ED Course. Discussion of management or test interpretation with external provider(s): Discussed with patient, found to have negative troponins, heart sac score of 6, however concerned given relief with nitroglycerin.  Talked with Dr. Duke Salvia, she is okay with  obs admission, she will admit patient, and evaluate in a.m.  Will need to have follow-up on pulmonary nodules in 3 to 6 months, as well as treatment of sinus infection.  Headache resolved  Risk OTC drugs. Prescription drug management. Decision regarding hospitalization.    Final Clinical Impression(s) / ED Diagnoses Final diagnoses:  Chest pain, unspecified type  Paresthesia  Pulmonary nodule  Acute non-recurrent maxillary sinusitis    Rx / DC Orders ED Discharge Orders     None         Pete Pelt, PA 01/23/23 1831    Arby Barrette, MD 01/24/23 1548

## 2023-01-23 NOTE — ED Notes (Signed)
Pt transported to imaging.

## 2023-01-23 NOTE — ED Notes (Signed)
Pt ambulatory to bathroom, standby assistance.

## 2023-01-24 DIAGNOSIS — E785 Hyperlipidemia, unspecified: Secondary | ICD-10-CM

## 2023-01-24 DIAGNOSIS — Z79899 Other long term (current) drug therapy: Secondary | ICD-10-CM | POA: Diagnosis not present

## 2023-01-24 DIAGNOSIS — Z87891 Personal history of nicotine dependence: Secondary | ICD-10-CM | POA: Diagnosis not present

## 2023-01-24 DIAGNOSIS — I200 Unstable angina: Secondary | ICD-10-CM | POA: Diagnosis not present

## 2023-01-24 DIAGNOSIS — E119 Type 2 diabetes mellitus without complications: Secondary | ICD-10-CM | POA: Diagnosis not present

## 2023-01-24 DIAGNOSIS — R079 Chest pain, unspecified: Secondary | ICD-10-CM | POA: Diagnosis present

## 2023-01-24 DIAGNOSIS — E65 Localized adiposity: Secondary | ICD-10-CM

## 2023-01-24 DIAGNOSIS — Z955 Presence of coronary angioplasty implant and graft: Secondary | ICD-10-CM | POA: Diagnosis not present

## 2023-01-24 DIAGNOSIS — J0100 Acute maxillary sinusitis, unspecified: Secondary | ICD-10-CM | POA: Diagnosis not present

## 2023-01-24 DIAGNOSIS — I25110 Atherosclerotic heart disease of native coronary artery with unstable angina pectoris: Secondary | ICD-10-CM | POA: Diagnosis not present

## 2023-01-24 DIAGNOSIS — R911 Solitary pulmonary nodule: Secondary | ICD-10-CM | POA: Diagnosis not present

## 2023-01-24 DIAGNOSIS — I10 Essential (primary) hypertension: Secondary | ICD-10-CM | POA: Diagnosis not present

## 2023-01-24 DIAGNOSIS — R202 Paresthesia of skin: Secondary | ICD-10-CM | POA: Diagnosis not present

## 2023-01-24 DIAGNOSIS — Z7902 Long term (current) use of antithrombotics/antiplatelets: Secondary | ICD-10-CM | POA: Diagnosis not present

## 2023-01-24 LAB — CREATININE, SERUM
Creatinine, Ser: 1.08 mg/dL (ref 0.61–1.24)
GFR, Estimated: >60 mL/min (ref >60)

## 2023-01-24 LAB — CBC
HCT: 46.8 % (ref 39.0–52.0)
Hemoglobin: 16.8 g/dL (ref 13.0–17.0)
MCH: 30.1 pg (ref 26.0–34.0)
MCHC: 35.9 g/dL (ref 30.0–36.0)
MCV: 83.7 fL (ref 80.0–100.0)
Platelets: 325 10*3/uL (ref 150–400)
RBC: 5.59 MIL/uL (ref 4.22–5.81)
RDW: 12.3 % (ref 11.5–15.5)
WBC: 21 10*3/uL — ABNORMAL HIGH (ref 4.0–10.5)
nRBC: 0 % (ref 0.0–0.2)

## 2023-01-24 LAB — MRSA NEXT GEN BY PCR, NASAL: MRSA by PCR Next Gen: NEGATIVE — AB

## 2023-01-24 MED ORDER — LISINOPRIL 2.5 MG PO TABS
2.5000 mg | ORAL_TABLET | Freq: Every day | ORAL | Status: DC
  Filled 2023-01-24: qty 1

## 2023-01-24 MED ORDER — ASPIRIN 81 MG PO TBEC
81.0000 mg | DELAYED_RELEASE_TABLET | Freq: Every day | ORAL | Status: DC
  Administered 2023-01-25: 81 mg via ORAL
  Filled 2023-01-24: qty 1

## 2023-01-24 MED ORDER — LISINOPRIL 2.5 MG PO TABS
2.5000 mg | ORAL_TABLET | Freq: Every day | ORAL | Status: DC
  Administered 2023-01-24 – 2023-01-25 (×2): 2.5 mg via ORAL
  Filled 2023-01-24 (×2): qty 1

## 2023-01-24 MED ORDER — ALLOPURINOL 300 MG PO TABS
300.0000 mg | ORAL_TABLET | Freq: Every day | ORAL | Status: DC
  Administered 2023-01-24 – 2023-01-25 (×2): 300 mg via ORAL
  Filled 2023-01-24: qty 3
  Filled 2023-01-24: qty 1

## 2023-01-24 MED ORDER — ACETAMINOPHEN 325 MG PO TABS
650.0000 mg | ORAL_TABLET | ORAL | Status: DC | PRN
  Administered 2023-01-24: 650 mg via ORAL
  Filled 2023-01-24: qty 2

## 2023-01-24 MED ORDER — NITROGLYCERIN 0.4 MG SL SUBL: 0.4000 mg | SUBLINGUAL_TABLET | SUBLINGUAL | Status: DC | PRN

## 2023-01-24 MED ORDER — EZETIMIBE 10 MG PO TABS
10.0000 mg | ORAL_TABLET | Freq: Every day | ORAL | Status: DC
  Administered 2023-01-24 – 2023-01-25 (×2): 10 mg via ORAL
  Filled 2023-01-24 (×2): qty 1

## 2023-01-24 MED ORDER — ONDANSETRON HCL 4 MG/2ML IJ SOLN: 4.0000 mg | Freq: Four times a day (QID) | INTRAMUSCULAR | Status: DC | PRN

## 2023-01-24 MED ORDER — CLOPIDOGREL BISULFATE 75 MG PO TABS
75.0000 mg | ORAL_TABLET | Freq: Every day | ORAL | Status: DC
  Administered 2023-01-24 – 2023-01-25 (×2): 75 mg via ORAL
  Filled 2023-01-24 (×2): qty 1

## 2023-01-24 MED ORDER — METOPROLOL TARTRATE 25 MG PO TABS
25.0000 mg | ORAL_TABLET | Freq: Two times a day (BID) | ORAL | Status: DC
  Administered 2023-01-24: 25 mg via ORAL
  Filled 2023-01-24: qty 1

## 2023-01-24 MED ORDER — ATORVASTATIN CALCIUM 80 MG PO TABS
80.0000 mg | ORAL_TABLET | Freq: Every day | ORAL | Status: DC
  Administered 2023-01-24 – 2023-01-25 (×2): 80 mg via ORAL
  Filled 2023-01-24: qty 1
  Filled 2023-01-24: qty 2

## 2023-01-24 MED ORDER — PROCHLORPERAZINE EDISYLATE 10 MG/2ML IJ SOLN
5.0000 mg | Freq: Once | INTRAMUSCULAR | Status: AC
  Administered 2023-01-24: 5 mg via INTRAVENOUS
  Filled 2023-01-24: qty 2

## 2023-01-24 MED ORDER — METOPROLOL TARTRATE 25 MG PO TABS
37.5000 mg | ORAL_TABLET | Freq: Two times a day (BID) | ORAL | Status: DC
  Administered 2023-01-24 – 2023-01-25 (×2): 37.5 mg via ORAL
  Filled 2023-01-24 (×2): qty 1

## 2023-01-24 MED ORDER — EMPAGLIFLOZIN-METFORMIN HCL ER 12.5-1000 MG PO TB24: 1.0000 | ORAL_TABLET | Freq: Every day | ORAL | Status: DC

## 2023-01-24 MED ORDER — METFORMIN HCL ER 500 MG PO TB24: 1000.0000 mg | ORAL_TABLET | Freq: Every day | ORAL | Status: DC

## 2023-01-24 MED ORDER — EMPAGLIFLOZIN 10 MG PO TABS: 10.0000 mg | ORAL_TABLET | Freq: Every day | ORAL | Status: DC

## 2023-01-24 MED ORDER — AMOXICILLIN-POT CLAVULANATE 875-125 MG PO TABS
1.0000 | ORAL_TABLET | Freq: Two times a day (BID) | ORAL | Status: DC
  Administered 2023-01-24 – 2023-01-25 (×3): 1 via ORAL
  Filled 2023-01-24 (×3): qty 1

## 2023-01-24 MED ORDER — HEPARIN SODIUM (PORCINE) 5000 UNIT/ML IJ SOLN
5000.0000 [IU] | Freq: Three times a day (TID) | INTRAMUSCULAR | Status: DC
  Administered 2023-01-24 – 2023-01-25 (×4): 5000 [IU] via SUBCUTANEOUS
  Filled 2023-01-24 (×4): qty 1

## 2023-01-24 NOTE — ED Notes (Signed)
Report was given to [facility]

## 2023-01-24 NOTE — H&P (Addendum)
Cardiology Admission History and Physical   Patient ID: Timothy [name]. MRN: [id number]; DOB: 06-11-1971   Admission date: 01/23/2023  PCP:  Irena Reichmann, DO   [facility] Providers Cardiologist:  Lance Muss, MD    Chief Complaint:  Chest pain  Patient Profile:   Timothy [name]. is a 52 y.o. male with CAD s/p PCI to OM, HTN, HLD, DM, gout who is being seen 01/24/2023 for the evaluation of chest pain.  History of Present Illness:   Timothy [name] is a 52 yo male with PMH noted above. He has been followed by Dr. Eldridge [name] as an outpatient. Presented with a STEMI 05/2019 with mLAD of 50%, mRCA of 25% and 99% OM2 treated with PCI/DES x1. Echo 05/2019 with LVEF of 50-55%, mild hypokinesis of the LV, basal-mid inferolateral wall, g1dd, normal RV. Last seen in the office on 12/2021 and reported doing well.   Presented to the ED on 8/12 with chest pain.  Reports he was in his usual state of health up until yesterday afternoon when he got to work.  He began to experience centralized chest pain with radiation over into his left arm and shoulder.  Also developed significant headache as well as odd metallic taste in his mouth.  Concerned about his symptoms he presented to the ED for further evaluation.  Labs in the ED showed sodium 135, potassium 4.1, creatinine 0.98, high-sensitivity troponin 2>>2, WBC 9.8, hemoglobin 16.1.  EKG showed sinus rhythm, 78 bpm, T wave inversion in lead III (similar to prior tracing).  Chest x-ray negative. CT head with possible odontogenic sinusitis. CT angio negative for PE, scattered bilateral solid pulmonary nodules, largest 6mm in the LLL with recommendations for non-contrast CT in 3-6 months   In talking with patient he reports being compliant with his home medications including aspirin, Plavix, statin and antihypertensives.    Past Medical History:  Diagnosis Date   Acute MI, lateral wall (HCC) 05/21/2019   Coronary artery disease    Hyperlipidemia     Hypertension    S/P angioplasty with stent to OM2  DES 05/21/2019   STEMI (ST elevation myocardial infarction) (HCC) 05/21/2019   Tobacco use     Past Surgical History:  Procedure Laterality Date   CORONARY/GRAFT ACUTE MI REVASCULARIZATION N/A 05/21/2019   Procedure: Coronary/Graft Acute MI Revascularization;  Surgeon: Corky Crafts, MD;  Location: [facility] INVASIVE CV LAB;  Service: Cardiovascular;  Laterality: N/A;   KNEE ARTHROSCOPY     LEFT HEART CATH AND CORONARY ANGIOGRAPHY N/A 05/21/2019   Procedure: LEFT HEART CATH AND CORONARY ANGIOGRAPHY;  Surgeon: Corky Crafts, MD;  Location: [facility] INVASIVE CV LAB;  Service: Cardiovascular;  Laterality: N/A;   SHOULDER SURGERY       Medications Prior to Admission: Prior to Admission medications   Medication Sig Start Date End Date Taking? Authorizing Provider  acetaminophen (TYLENOL) 325 MG tablet Take 650 mg by mouth every 6 (six) hours as needed for mild pain, fever or headache.   Yes [provider]  allopurinol (ZYLOPRIM) 300 MG tablet Take 300 mg by mouth daily.  07/06/18  Yes [provider]  atorvastatin (LIPITOR) 80 MG tablet Take 1 tablet (80 mg total) by mouth daily. 01/14/22  Yes Corky Crafts, MD  calcium carbonate (TUMS - DOSED IN MG ELEMENTAL CALCIUM) 500 MG chewable tablet Chew 1,000-1,500 tablets by mouth 3 (three) times daily as needed for indigestion or heartburn.   Yes [provider]  clopidogrel (PLAVIX) 75  MG tablet TAKE 1 TABLET BY MOUTH EVERY DAY 11/04/22  Yes Corky Crafts, MD  Empagliflozin-metFORMIN HCl ER (SYNJARDY XR) 12.10-998 MG TB24 Take 1 tablet by mouth daily. 09/10/21  Yes [provider]  famotidine (PEPCID) 20 MG tablet TAKE 1 TABLET BY MOUTH EVERY DAY Patient taking differently: Take 20 mg by mouth daily. 03/31/20  Yes Leone Brand, NP  lisinopril (ZESTRIL) 2.5 MG tablet TAKE 1 TABLET BY MOUTH EVERY DAY 02/07/22  Yes Corky Crafts, MD  metoprolol  tartrate (LOPRESSOR) 25 MG tablet TAKE 1 TABLET BY MOUTH TWICE A DAY 02/07/22  Yes Corky Crafts, MD  nitroGLYCERIN (NITROSTAT) 0.4 MG SL tablet Place 1 tablet (0.4 mg total) under the tongue every 5 (five) minutes as needed. Patient taking differently: Place 0.4 mg under the tongue every 5 (five) minutes as needed for chest pain. 05/23/19  Yes Laverda Page B, NP  ezetimibe (ZETIA) 10 MG tablet Take 1 tablet (10 mg total) by mouth daily. 02/07/22   Corky Crafts, MD  OZEMPIC, 0.25 OR 0.5 MG/DOSE, 2 MG/1.5ML SOPN Inject 0.5 mg into the skin once a week. Patient not taking: Reported on 01/10/2022 05/12/20   [provider]     Allergies:    Allergies  Allergen Reactions   Morphine And Codeine Hives   Bee Venom     Social History:   Social History   Socioeconomic History   Marital status: Widowed    Spouse name: Not on file   Number of children: Not on file   Years of education: Not on file   Highest education level: Not on file  Occupational History   Not on file  Tobacco Use   Smoking status: Former    Current packs/day: 1.00    Types: Cigarettes   Smokeless tobacco: Current    Types: Snuff  Vaping Use   Vaping status: Never Used  Substance and Sexual Activity   Alcohol use: Yes    Comment: rare   Drug use: No   Sexual activity: Yes  Other Topics Concern   Not on file  Social History Narrative   Not on file   Social Determinants of Health   Financial Resource Strain: Not on file  Food Insecurity: Not on file  Transportation Needs: Not on file  Physical Activity: Not on file  Stress: Not on file  Social Connections: Not on file  Intimate Partner Violence: Not on file    Family History:   The patient's family history includes Diabetes in his mother.    ROS:  Please see the history of present illness.  All other ROS reviewed and negative.     Physical Exam/Data:   Vitals:   01/24/23 0705 01/24/23 0940 01/24/23 1056 01/24/23 1256   BP: 106/63  136/77 122/83  Pulse: (!) 103  (!) 112 83  Resp: (!) 26  (!) 22 20  Temp:  98.5 F (36.9 C)  98 F (36.7 C)  TempSrc:  Oral  Oral  SpO2: 97%  96% 95%  Weight:      Height:        Intake/Output Summary (Last 24 hours) at 01/24/2023 1304 Last data filed at 01/23/2023 1650 Gross per 24 hour  Intake 1000 ml  Output --  Net 1000 ml      01/23/2023    2:35 PM 01/10/2022   10:48 AM 01/03/2021    6:21 AM  Last 3 Weights  Weight (lbs) 250 lb 251 lb 266 lb  12.1 oz  Weight (kg) 113.399 kg 113.853 kg 121 kg     Body mass index is 35.87 kg/m.  General:  Well nourished, well developed, in no acute distress HEENT: normal Neck: no JVD Vascular: No carotid bruits; Distal pulses 2+ bilaterally   Cardiac:  normal S1, S2; RRR; no murmur  Lungs:  clear to auscultation bilaterally, no wheezing, rhonchi or rales  Abd: soft, nontender, no hepatomegaly  Ext: no edema Musculoskeletal:  No deformities, BUE and BLE strength normal and equal Skin: warm and dry  Neuro:  CNs 2-12 intact, no focal abnormalities noted Psych:  Normal affect    EKG:  The ECG that was done 8/12 was personally reviewed and demonstrates sinus rhythm, 78 bpm with TWI in lead III  Relevant CV Studies:  Echo: 05/2019  IMPRESSIONS     1. Left ventricular ejection fraction, by visual estimation, is 50 to  55%. The left ventricle has normal function. There is no left ventricular  hypertrophy.   2. Mild hypokinesis of the left ventricular, basal-mid inferolateral  wall.   3. Left ventricular diastolic parameters are consistent with Grade I  diastolic dysfunction (impaired relaxation).   4. The left ventricle demonstrates regional wall motion abnormalities.   5. Global right ventricle has normal systolic function.The right  ventricular size is normal. No increase in right ventricular wall  thickness.   6. Left atrial size was normal.   7. Right atrial size was normal.   8. Presence of pericardial fat  pad.   9. Trivial pericardial effusion is present.  10. The mitral valve is grossly normal. Trivial mitral valve  regurgitation.  11. The tricuspid valve is grossly normal. Tricuspid valve regurgitation  is trivial.  12. The aortic valve is tricuspid. Aortic valve regurgitation is not  visualized.  13. The pulmonic valve was grossly normal. Pulmonic valve regurgitation is  not visualized.  14. TR signal is inadequate for assessing pulmonary artery systolic  pressure.  15. The inferior vena cava is normal in size with greater than 50%  respiratory variability, suggesting right atrial pressure of 3 mmHg.  16. No prior Echocardiogram.   FINDINGS   Left Ventricle: Left ventricular ejection fraction, by visual estimation,  is 50 to 55%. The left ventricle has normal function. Mild hypokinesis of  the left ventricular, basal-mid inferolateral wall. The left ventricle  demonstrates regional wall motion  abnormalities. The left ventricular internal cavity size was the left  ventricle is normal in size. There is no left ventricular hypertrophy.  Left ventricular diastolic parameters are consistent with Grade I  diastolic dysfunction (impaired relaxation).  Normal left atrial pressure.   Right Ventricle: The right ventricular size is normal. No increase in  right ventricular wall thickness. Global RV systolic function is has  normal systolic function.   Left Atrium: Left atrial size was normal in size.   Right Atrium: Right atrial size was normal in size   Pericardium: Trivial pericardial effusion is present. Presence of  pericardial fat pad.   Mitral Valve: The mitral valve is grossly normal. Trivial mitral valve  regurgitation.   Tricuspid Valve: The tricuspid valve is grossly normal. Tricuspid valve  regurgitation is trivial.   Aortic Valve: The aortic valve is tricuspid. Aortic valve regurgitation is  not visualized. There is mild calcification of the aortic valve.   Pulmonic  Valve: The pulmonic valve was grossly normal. Pulmonic valve  regurgitation is not visualized. Pulmonic regurgitation is not visualized.   Aorta: The aortic root  is normal in size and structure.   Venous: The inferior vena cava is normal in size with greater than 50%  respiratory variability, suggesting right atrial pressure of 3 mmHg.   IAS/Shunts: No atrial level shunt detected by color flow Doppler.   Cath: 05/2019  Mid LAD lesion is 50% stenosed. Mid RCA lesion is 25% stenosed. 2nd Mrg lesion is 99% stenosed. A drug-eluting stent was successfully placed using a STENT RESOLUTE ONYX 2.0X22. Post intervention, there is a 0% residual stenosis. The left ventricular systolic function is normal. LV end diastolic pressure is normal. The left ventricular ejection fraction is 50-55% by visual estimate. There is no aortic valve stenosis. Short aortic arch making JL3.5 difficult to use. EBU 3 Guide catheter was used to engage left main/LAD. EBU 3.5 may be a better choice if PCI was needed.   Continue aggressive secondary prevention along with DAPT.     We spoke about stopping smoking and other lifestyle modifications.   Diagnostic Dominance: Right  Intervention    Laboratory Data:  High Sensitivity Troponin:   Recent Labs  Lab 01/23/23 1441 01/23/23 1719  TROPONINIHS 2 2      Chemistry Recent Labs  Lab 01/23/23 1500  NA 135  K 4.1  CL 104  CO2 23  GLUCOSE 171*  BUN 7  CREATININE 0.98  CALCIUM 8.9  GFRNONAA >60  ANIONGAP 8    Recent Labs  Lab 01/23/23 1500  PROT 6.6  ALBUMIN 3.7  AST 24  ALT 30  ALKPHOS 84  BILITOT 0.8   Lipids No results for input(s): "CHOL", "TRIG", "HDL", "LABVLDL", "LDLCALC", "CHOLHDL" in the last 168 hours. Hematology Recent Labs  Lab 01/23/23 1441  WBC 9.8  RBC 5.49  HGB 16.1  HCT 45.4  MCV 82.7  MCH 29.3  MCHC 35.5  RDW 12.1  PLT 306   Thyroid No results for input(s): "TSH", "FREET4" in the last 168 hours. BNPNo  results for input(s): "BNP", "PROBNP" in the last 168 hours.  DDimer No results for input(s): "DDIMER" in the last 168 hours.   Radiology/Studies:  CT Angio Chest/Abd/Pel for Dissection W and/or Wo Contrast  Result Date: 01/23/2023 CLINICAL DATA:  Left chest pain and left arm pain EXAM: CT ANGIOGRAPHY CHEST, ABDOMEN AND PELVIS TECHNIQUE: Non-contrast CT of the chest was initially obtained. Multidetector CT imaging through the chest, abdomen and pelvis was performed using the standard protocol during bolus administration of intravenous contrast. Multiplanar reconstructed images and MIPs were obtained and reviewed to evaluate the vascular anatomy. RADIATION DOSE REDUCTION: This exam was performed according to the departmental dose-optimization program which includes automated exposure control, adjustment of the mA and/or kV according to patient size and/or use of iterative reconstruction technique. CONTRAST:  OMNIPAQUE IOHEXOL 350 MG/ML SOLN COMPARISON:  None Available. FINDINGS: CTA CHEST FINDINGS Cardiovascular: Normal heart size. No pericardial effusion. No evidence of pulmonary embolus. Mild coronary artery calcifications. Mediastinum/Nodes: Esophagus and thyroid are unremarkable. No enlarged lymph nodes seen in the chest. Lungs/Pleura: Central airways are patent. No consolidation, pleural effusion or pneumothorax. Scattered bilateral solid pulmonary nodules, largest is a 6 mm nodule of the left lower lobe located on series 6, image 33. Musculoskeletal: No aggressive appearing osseous lesions. Review of the MIP images confirms the above findings. CTA ABDOMEN AND PELVIS FINDINGS VASCULAR Aorta: Normal caliber aorta without aneurysm, dissection, vasculitis or significant stenosis. Standard three-vessel aortic arch with no significant stenosis. Celiac: Patent without evidence of dissection or vasculitis. Moderate narrowing at the origin due  to diaphragmatic compression. SMA: Patent without evidence of  aneurysm, dissection, vasculitis or significant stenosis. Renals: Both renal arteries are patent without evidence of aneurysm, dissection, vasculitis, fibromuscular dysplasia or significant stenosis. IMA: Patent without evidence of aneurysm, dissection, vasculitis or significant stenosis. Inflow: Patent without evidence of aneurysm, dissection, vasculitis or significant stenosis. Veins: No obvious venous abnormality within the limitations of this arterial phase study. Review of the MIP images confirms the above findings. NON-VASCULAR Hepatobiliary: Tiny focal area of arterial hyperenhancement measuring 5 mm on series 5, image 91, likely a flash fill hemangioma or perfusional. No suspicious focal liver abnormality is seen. No gallstones, gallbladder wall thickening, or biliary dilatation. Pancreas: Unremarkable. No pancreatic ductal dilatation or surrounding inflammatory changes. Spleen: Normal in size without focal abnormality. Adrenals/Urinary Tract: Bilateral adrenal glands are unremarkable. No hydronephrosis or nephrolithiasis. Bladder is decompressed. Stomach/Bowel: Stomach is within normal limits. Appendix appears normal. No evidence of bowel wall thickening, distention, or inflammatory changes. Lymphatic: No enlarged lymph nodes seen in the chest. Reproductive: Prostate is unremarkable. Other: Small bilateral fat containing inguinal hernias. Musculoskeletal: No acute or significant osseous findings. Review of the MIP images confirms the above findings. IMPRESSION: 1. Normal caliber aorta with no evidence of acute aortic syndrome. 2. No evidence of pulmonary embolus. 3. No acute findings in the chest, abdomen or pelvis. 4. Scattered bilateral solid pulmonary nodules, largest is a 6 mm nodule of the left lower lobe. Non-contrast chest CT at 3-6 months is recommended. If the nodules are stable at time of repeat CT, then future CT at 18-24 months (from today's scan) is considered optional for low-risk patients,  but is recommended for high-risk patients. This recommendation follows the consensus statement: Guidelines for Management of Incidental Pulmonary Nodules Detected on CT Images: From the Fleischner Society 2017; Radiology 2017; 284:228-243. 5. Mild coronary artery calcifications and aortic Atherosclerosis (ICD10-I70.0). Electronically Signed   By: Allegra Lai M.D.   On: 01/23/2023 16:34   CT Head Wo Contrast  Result Date: 01/23/2023 CLINICAL DATA:  Provided history: Headache, sudden, severe. Left arm numbness. Headache. EXAM: CT HEAD WITHOUT CONTRAST TECHNIQUE: Contiguous axial images were obtained from the base of the skull through the vertex without intravenous contrast. RADIATION DOSE REDUCTION: This exam was performed according to the departmental dose-optimization program which includes automated exposure control, adjustment of the mA and/or kV according to patient size and/or use of iterative reconstruction technique. COMPARISON:  Brain MRI 01/03/2021.  Head CT 01/03/2021. FINDINGS: Brain: Cerebral volume is normal. There is no acute intracranial hemorrhage. No demarcated cortical infarct. No extra-axial fluid collection. No evidence of an intracranial mass. No midline shift. Vascular: No hyperdense vessel.  Atherosclerotic calcifications. Skull: No calvarial fracture or aggressive osseous lesion. Sinuses/Orbits: No mass or acute finding within the imaged orbits. Minimal mucosal thickening within the right maxillary sinus. 2.1 cm mucous retention cyst, and mild background mucosal thickening, within the left maxillary sinus. Other: There is periapical lucency surrounding the left maxillary second molar tooth. IMPRESSION: 1.  No evidence of an acute intracranial abnormality. 2. Paranasal sinus disease as described. There is periapical lucency surrounding the left maxillary second molar tooth, and the left maxillary sinus findings could reflect odontogenic sinusitis. Electronically Signed   By: Jackey Loge D.O.   On: 01/23/2023 16:33   DG Chest Portable 1 View  Result Date: 01/23/2023 CLINICAL DATA:  Chest pain EXAM: PORTABLE CHEST 1 VIEW COMPARISON:  01/03/2021 FINDINGS: No consolidation, pneumothorax or effusion. No edema. Normal cardiopericardial silhouette. Degenerative changes are  seen along the spine. Film is under penetrated. The extreme right inferior costophrenic angle is clipped off the edge of the film. IMPRESSION: No acute cardiopulmonary disease Electronically Signed   By: Karen Kays M.D.   On: 01/23/2023 15:20     Assessment and Plan:   Chaynce Comunale. is a 52 y.o. male with CAD s/p PCI to OM, HTN, HLD, DM, gout who is being seen 01/24/2023 for the evaluation of chest pain.  Unstable Angina CAD status post PCI to OM '20 -- Presented with centralized chest pain with radiation into his left arm which is similar to what he experienced with previous anginal episode requiring stent to OM, did have residual disease of 50% mLAD. hsTn negative x2, EKG without ischemic changes. -- given concerning symptoms for unstable angina, will plan for cardiac cath tomorrow -- continue ASA, plavix, statin, metoprolol   Informed Consent   Shared Decision Making/Informed Consent The risks [stroke (1 in 1000), death (1 in 1000), kidney failure [usually temporary] (1 in 500), bleeding (1 in 200), allergic reaction [possibly serious] (1 in 200)], benefits (diagnostic support and management of coronary artery disease) and alternatives of a cardiac catheterization were discussed in detail with Timothy [name] and he is willing to proceed.    HTN -- blood pressures controlled -- increase metoprolol from 25mg  BID to 37.5mg  BID, lisinopril 2.5mg  daily   HLD -- check lipids -- continue atorvastatin 80mg  daily   Sinusitis  -- CT with possible sinusitis, was started on Augmentin 875-125 the ED -- will continue for total of 5 days  Pulmonary nodules -- will need follow up outpatient non contrast CT in 3-6  months  Risk Assessment/Risk Scores:   Code Status: Full Code  Severity of Illness: The appropriate patient status for this patient is OBSERVATION. Observation status is judged to be reasonable and necessary in order to provide the required intensity of service to ensure the patient's safety. The patient's presenting symptoms, physical exam findings, and initial radiographic and laboratory data in the context of their medical condition is felt to place them at decreased risk for further clinical deterioration. Furthermore, it is anticipated that the patient will be medically stable for discharge from the hospital within 2 midnights of admission.    For questions or updates, please contact [facility] Please consult [email or web address] for contact info under     Signed, Laverda Page, NP  01/24/2023 1:04 PM    Patient seen and examined. Agree with assessment and plan.  Timothy [name] is a 52 year old patient of Dr. Eldridge [name] who has known CAD and in the setting of a STEMI in December 2020 underwent stenting of a subtotal 99% OM 2 vessel he had concomitant CAD with mid 50% LAD stenosis and mid 25% RCA stenosis.  An echo Doppler study showed an EF of 50 to 55% with mild hypokinesis in the basal to mid inferolateral wall and grade 1 diastolic dysfunction.  The patient subsequently discontinued smoking.  He works as a Human resources officer.  Today he developed very similar substernal chest tightness with left arm radiation similar to his previous unstable anginal symptomatology.  He developed headache as well as metallic taste in his mouth.  He presented to the emergency room.  ECG showed sinus rhythm with previously noted T wave inversion in lead III.  Presently, blood pressure 122/83, pulse is in the mid 80s but previously was tachycardic at 112 this morning.  H&T is unremarkable.  He has a thick neck.  Lungs are clear.  Rhythm is regular with no S3 or S4 gallop.  There is no chest wall tenderness  to palpation.  He has moderate central adiposity.  Pulses were 2+.  There is no clubbing cyanosis or edema.  Neurologic exam was grossly nonfocal.  Patient presents with symptom complex similar to his prior coronary event in 2020 when he was found to have subtotal 99% circumflex marginal stenosis.  Presently asymptomatic.  With his increased heart rate, will titrate beta-blocker therapy metoprolol 50 mg twice a day and add low-dose isosorbide 30 mg.  With similar symptomatology to his prior coronary event, I have recommended definitive cardiac catheterization.The risks and benefits of a cardiac catheterization including, but not limited to, death, stroke, MI, kidney damage and bleeding were discussed with the patient who indicates understanding and agrees to proceed.  Repeat laboratory in a.m., serial troponins, and check fasting lipid panel.  Plan cardiac catheterization tomorrow.   Lennette Bihari, MD, [facility] 01/24/2023 2:51 PM

## 2023-01-24 NOTE — Progress Notes (Signed)
   01/24/23 1528  TOC Brief Assessment  Insurance and Status Reviewed  Patient has primary care physician Yes  Home environment has been reviewed single family home  Prior level of function: independent  Prior/Current Home Services No current home services  Social Determinants of Health Reivew SDOH reviewed no interventions necessary  Readmission risk has been reviewed Yes  Transition of care needs no transition of care needs at this time

## 2023-01-25 ENCOUNTER — Observation Stay (HOSPITAL_BASED_OUTPATIENT_CLINIC_OR_DEPARTMENT_OTHER): Payer: PRIVATE HEALTH INSURANCE

## 2023-01-25 ENCOUNTER — Encounter (HOSPITAL_COMMUNITY)
Admission: EM | Disposition: A | Discharge status: Home / Self Care | Payer: Self-pay | Source: Home / Self Care | Attending: Emergency Medicine

## 2023-01-25 DIAGNOSIS — E785 Hyperlipidemia, unspecified: Secondary | ICD-10-CM | POA: Diagnosis not present

## 2023-01-25 DIAGNOSIS — R079 Chest pain, unspecified: Secondary | ICD-10-CM | POA: Diagnosis not present

## 2023-01-25 DIAGNOSIS — R911 Solitary pulmonary nodule: Secondary | ICD-10-CM

## 2023-01-25 DIAGNOSIS — I2510 Atherosclerotic heart disease of native coronary artery without angina pectoris: Secondary | ICD-10-CM | POA: Diagnosis not present

## 2023-01-25 DIAGNOSIS — I25110 Atherosclerotic heart disease of native coronary artery with unstable angina pectoris: Secondary | ICD-10-CM | POA: Diagnosis not present

## 2023-01-25 HISTORY — PX: LEFT HEART CATH AND CORONARY ANGIOGRAPHY: CATH118249

## 2023-01-25 LAB — CBC WITH DIFFERENTIAL/PLATELET
Abs Immature Granulocytes: 0.06 10*3/uL (ref 0.00–0.07)
Basophils Absolute: 0 10*3/uL (ref 0.0–0.1)
Basophils Relative: 0 %
Eosinophils Absolute: 0.1 10*3/uL (ref 0.0–0.5)
Eosinophils Relative: 0 %
HCT: 44.1 % (ref 39.0–52.0)
Hemoglobin: 15.2 g/dL (ref 13.0–17.0)
Immature Granulocytes: 0 %
Lymphocytes Relative: 19 %
Lymphs Abs: 2.7 10*3/uL (ref 0.7–4.0)
MCH: 29.2 pg (ref 26.0–34.0)
MCHC: 34.5 g/dL (ref 30.0–36.0)
MCV: 84.6 fL (ref 80.0–100.0)
Monocytes Absolute: 0.8 10*3/uL (ref 0.1–1.0)
Monocytes Relative: 6 %
Neutro Abs: 10.9 10*3/uL — ABNORMAL HIGH (ref 1.7–7.7)
Neutrophils Relative %: 75 %
Platelets: 293 10*3/uL (ref 150–400)
RBC: 5.21 MIL/uL (ref 4.22–5.81)
RDW: 12.7 % (ref 11.5–15.5)
WBC: 14.5 10*3/uL — ABNORMAL HIGH (ref 4.0–10.5)
nRBC: 0 % (ref 0.0–0.2)

## 2023-01-25 SURGERY — LEFT HEART CATH AND CORONARY ANGIOGRAPHY
Anesthesia: LOCAL

## 2023-01-25 MED ORDER — CALCIUM CARBONATE ANTACID 500 MG PO CHEW: 1.0000 | CHEWABLE_TABLET | Freq: Three times a day (TID) | ORAL | Status: AC | PRN

## 2023-01-25 MED ORDER — MIDAZOLAM HCL 2 MG/2ML IJ SOLN
INTRAMUSCULAR | Status: DC | PRN
  Administered 2023-01-25: 1 mg via INTRAVENOUS
  Administered 2023-01-25: 2 mg via INTRAVENOUS

## 2023-01-25 MED ORDER — HEPARIN (PORCINE) IN NACL 1000-0.9 UT/500ML-% IV SOLN
INTRAVENOUS | Status: DC | PRN
  Administered 2023-01-25 (×2): 500 mL

## 2023-01-25 MED ORDER — LIDOCAINE HCL (PF) 1 % IJ SOLN
INTRAMUSCULAR | Status: DC | PRN
  Administered 2023-01-25: 2 mL

## 2023-01-25 MED ORDER — LIDOCAINE HCL (PF) 1 % IJ SOLN
INTRAMUSCULAR | Status: AC
  Filled 2023-01-25: qty 30

## 2023-01-25 MED ORDER — AMOXICILLIN-POT CLAVULANATE 875-125 MG PO TABS: 1.0000 | ORAL_TABLET | Freq: Two times a day (BID) | ORAL | 0 refills | Status: AC

## 2023-01-25 MED ORDER — LIVING WELL WITH DIABETES BOOK
Freq: Once | Status: AC
  Filled 2023-01-25: qty 1

## 2023-01-25 MED ORDER — CLOPIDOGREL BISULFATE 75 MG PO TABS: 75.0000 mg | ORAL_TABLET | Freq: Every day | ORAL | 1 refills | Status: DC

## 2023-01-25 MED ORDER — ASPIRIN 81 MG PO TBEC: 81.0000 mg | DELAYED_RELEASE_TABLET | Freq: Every day | ORAL | Status: AC

## 2023-01-25 MED ORDER — SODIUM CHLORIDE 0.9 % IV SOLN: 250.0000 mL | INTRAVENOUS | Status: DC | PRN

## 2023-01-25 MED ORDER — VERAPAMIL HCL 2.5 MG/ML IV SOLN
INTRAVENOUS | Status: AC
  Filled 2023-01-25: qty 2

## 2023-01-25 MED ORDER — VERAPAMIL HCL 2.5 MG/ML IV SOLN
INTRAVENOUS | Status: DC | PRN
  Administered 2023-01-25: 10 mL via INTRA_ARTERIAL

## 2023-01-25 MED ORDER — MIDAZOLAM HCL 2 MG/2ML IJ SOLN
INTRAMUSCULAR | Status: AC
  Filled 2023-01-25: qty 2

## 2023-01-25 MED ORDER — FENTANYL CITRATE (PF) 100 MCG/2ML IJ SOLN
INTRAMUSCULAR | Status: DC | PRN
  Administered 2023-01-25: 25 ug via INTRAVENOUS

## 2023-01-25 MED ORDER — ASPIRIN 81 MG PO CHEW
81.0000 mg | CHEWABLE_TABLET | ORAL | Status: AC
  Administered 2023-01-25: 81 mg via ORAL
  Filled 2023-01-25: qty 1

## 2023-01-25 MED ORDER — IOHEXOL 350 MG/ML SOLN
INTRAVENOUS | Status: DC | PRN
  Administered 2023-01-25: 56 mL

## 2023-01-25 MED ORDER — SODIUM CHLORIDE 0.9 % WEIGHT BASED INFUSION: 1.0000 mL/kg/h | INTRAVENOUS | Status: DC

## 2023-01-25 MED ORDER — HEPARIN SODIUM (PORCINE) 1000 UNIT/ML IJ SOLN
INTRAMUSCULAR | Status: AC
  Filled 2023-01-25: qty 10

## 2023-01-25 MED ORDER — SODIUM CHLORIDE 0.9 % WEIGHT BASED INFUSION
3.0000 mL/kg/h | INTRAVENOUS | Status: DC
  Administered 2023-01-25: 3 mL/kg/h via INTRAVENOUS

## 2023-01-25 MED ORDER — SODIUM CHLORIDE 0.9% FLUSH
3.0000 mL | Freq: Two times a day (BID) | INTRAVENOUS | Status: DC
  Administered 2023-01-25: 3 mL via INTRAVENOUS

## 2023-01-25 MED ORDER — HEPARIN SODIUM (PORCINE) 1000 UNIT/ML IJ SOLN
INTRAMUSCULAR | Status: DC | PRN
  Administered 2023-01-25: 5000 [IU] via INTRAVENOUS

## 2023-01-25 MED ORDER — SODIUM CHLORIDE 0.9% FLUSH: 3.0000 mL | INTRAVENOUS | Status: DC | PRN

## 2023-01-25 MED ORDER — FENTANYL CITRATE (PF) 100 MCG/2ML IJ SOLN
INTRAMUSCULAR | Status: AC
  Filled 2023-01-25: qty 2

## 2023-01-25 MED ORDER — METOPROLOL TARTRATE 37.5 MG PO TABS: 37.5000 mg | ORAL_TABLET | Freq: Two times a day (BID) | ORAL | 2 refills | Status: DC

## 2023-01-25 SURGICAL SUPPLY — 10 items
CATH 5FR JL3.5 JR4 ANG PIG MP (CATHETERS) IMPLANT
CATH INFINITI 5 FR MPA2 (CATHETERS) IMPLANT
DEVICE RAD COMP TR BAND LRG (VASCULAR PRODUCTS) IMPLANT
GLIDESHEATH SLEND SS 6F .021 (SHEATH) IMPLANT
GUIDEWIRE INQWIRE 1.5J.035X260 (WIRE) IMPLANT
INQWIRE 1.5J .035X260CM (WIRE) ×1
PACK CARDIAC CATHETERIZATION (CUSTOM PROCEDURE TRAY) ×1 IMPLANT
PROTECTION STATION PRESSURIZED (MISCELLANEOUS) ×1
SET ATX-X65L (MISCELLANEOUS) IMPLANT
STATION PROTECTION PRESSURIZED (MISCELLANEOUS) IMPLANT

## 2023-01-25 NOTE — Discharge Instructions (Signed)
Keep follow-up appointments, take medications as prescribed

## 2023-01-25 NOTE — Progress Notes (Signed)
Ivs to LFA and RAC removed, cath tip intact and dsg applied. Pt tolerated well. Discharge instructions reviewed with patient and his wife. Work note given to pt and pt walked to vehicle with his wife.

## 2023-01-25 NOTE — Plan of Care (Signed)
DM education booklet reviewed with pt, family at bedside. Denied the need for more education on the matter.

## 2023-01-25 NOTE — Discharge Summary (Addendum)
Discharge Summary    Patient ID: Timothy [name]. MRN: [id number]; DOB: Aug 16, 1970  Admit date: 01/23/2023 Discharge date: 01/25/2023  PCP:  Irena Reichmann, DO   [facility] Providers Cardiologist:  Lance Muss, MD     Discharge Diagnoses    Principal Problem:   Chest pain Active Problems:   Hyperlipidemia   Hypertension   Coronary artery disease   Morbid obesity (HCC)   Lung nodule seen on imaging study   Hyperlipidemia with target low density lipoprotein (LDL) cholesterol less than 50 mg/dL  Diagnostic Studies/Procedures    Cath: 01/25/2023    Mid LAD lesion is 50% stenosed.   Mid RCA lesion is 25% stenosed.   Non-stenotic 2nd Mrg lesion was previously treated.   LV end diastolic pressure is moderately elevated.   Nonobstructive CAD. Continued patency of stent in the LCx. LAD disease unchanged from prior Normal LV function.  Moderately elevated LVEDP   Plan: continue medical therapy. Anticipate DC home today  Echo: 01/25/2023  IMPRESSIONS     1. Left ventricular ejection fraction, by estimation, is 55 to 60%. The  left ventricle has normal function. The left ventricle has no regional  wall motion abnormalities. Left ventricular diastolic parameters were  normal.   2. Right ventricular systolic function is normal. The right ventricular  size is normal.   3. The mitral valve is normal in structure. No evidence of mitral valve  regurgitation. No evidence of mitral stenosis.   4. The aortic valve is tricuspid. Aortic valve regurgitation is not  visualized. Aortic valve sclerosis is present, with no evidence of aortic  valve stenosis.   5. The inferior vena cava is normal in size with greater than 50%  respiratory variability, suggesting right atrial pressure of 3 mmHg.   FINDINGS   Left Ventricle: Left ventricular ejection fraction, by estimation, is 55  to 60%. The left ventricle has normal function. The left ventricle has no  regional wall  motion abnormalities. The left ventricular internal cavity  size was normal in size. There is   no left ventricular hypertrophy. Left ventricular diastolic parameters  were normal.   Right Ventricle: The right ventricular size is normal. Right ventricular  systolic function is normal.   Left Atrium: Left atrial size was normal in size.   Right Atrium: Right atrial size was normal in size.   Pericardium: There is no evidence of pericardial effusion.   Mitral Valve: The mitral valve is normal in structure. No evidence of  mitral valve regurgitation. No evidence of mitral valve stenosis. MV peak  gradient, 4.6 mmHg. The mean mitral valve gradient is 2.0 mmHg.   Tricuspid Valve: The tricuspid valve is normal in structure. Tricuspid  valve regurgitation is trivial. No evidence of tricuspid stenosis.   Aortic Valve: The aortic valve is tricuspid. Aortic valve regurgitation is  not visualized. Aortic valve sclerosis is present, with no evidence of  aortic valve stenosis. Aortic valve mean gradient measures 4.0 mmHg.  Aortic valve peak gradient measures 6.7   mmHg. Aortic valve area, by VTI measures 2.83 cm.   Pulmonic Valve: The pulmonic valve was normal in structure. Pulmonic valve  regurgitation is not visualized. No evidence of pulmonic stenosis.   Aorta: The aortic root is normal in size and structure.   Venous: The inferior vena cava is normal in size with greater than 50%  respiratory variability, suggesting right atrial pressure of 3 mmHg.   IAS/Shunts: No atrial level shunt detected by color flow  Doppler.  _____________   History of Present Illness     Timothy [name]. is a 52 y.o. male with CAD s/p PCI to OM, HTN, HLD, DM, gout who is being seen 01/24/2023 for the evaluation of chest pain.  He has been followed by Dr. Eldridge Dace as an outpatient. Presented with a STEMI 05/2019 with mLAD of 50%, mRCA of 25% and 99% OM2 treated with PCI/DES x1. Echo 05/2019 with LVEF of 50-55%,  mild hypokinesis of the LV, basal-mid inferolateral wall, g1dd, normal RV. Last seen in the office on 12/2021 and reported doing well.    Presented to the ED on 8/12 with chest pain.  Reported he was in his usual state of health up until the  afternoon when he got to work.  He began to experience centralized chest pain with radiation over into his left arm and shoulder.  Also developed significant headache as well as odd metallic taste in his mouth.  Concerned about his symptoms he presented to the ED for further evaluation.   Labs in the ED showed sodium 135, potassium 4.1, creatinine 0.98, high-sensitivity troponin 2>>2, WBC 9.8, hemoglobin 16.1.  EKG showed sinus rhythm, 78 bpm, T wave inversion in lead III (similar to prior tracing).  Chest x-ray negative. CT head with possible odontogenic sinusitis. CT angio negative for PE, scattered bilateral solid pulmonary nodules, largest 6mm in the LLL with recommendations for non-contrast CT in 3-6 months    In talking with patient he reported being compliant with his home medications including aspirin, Plavix, statin and antihypertensives.   [name] Course     Unstable Angina CAD status post PCI to OM '20 -- Presented with centralized chest pain with radiation into his left arm which is similar to what he experienced with previous anginal episode requiring stent to OM, did have residual disease of 50% mLAD. hsTn negative x2, EKG without ischemic changes. Underwent cardiac cath noted above with patent LCx, LAD disease unchanged. Recommendations for medical therapy.  -- continue ASA, plavix, statin, metoprolol    HTN -- blood pressures controlled -- continue metoprolol from 25mg  BID to 37.5mg  BID, lisinopril 2.5mg  daily    HLD -- LDL 127, HDL 35 -- continue atorvastatin 80mg  daily    Sinusitis  -- CT with possible sinusitis, was started on Augmentin 875-125 the ED -- will continue for total of 5 days   Pulmonary nodules -- will need follow up  outpatient non contrast CT in 3-6 months  Patient was seen by Dr. Tresa Endo and deemed stable for discharge home. Follow up in the office arranged.  _____________  Discharge Vitals Blood pressure 128/77, pulse 69, temperature (!) 97.5 F (36.4 C), temperature source Oral, resp. rate 16, height 5\' 10"  (1.778 m), weight 114.5 kg, SpO2 96%.  Filed Weights   01/23/23 1435 01/24/23 1256 01/25/23 0620  Weight: 113.4 kg 115.6 kg 114.5 kg    Labs & Radiologic Studies    CBC Recent Labs    01/24/23 1438 01/25/23 0255  WBC 21.0* 14.5*  NEUTROABS  --  10.9*  HGB 16.8 15.2  HCT 46.8 44.1  MCV 83.7 84.6  PLT 325 293   Basic Metabolic Panel Recent Labs    25/36/64 1500 01/24/23 1438 01/25/23 0255  NA 135  --  135  K 4.1  --  3.7  CL 104  --  101  CO2 23  --  23  GLUCOSE 171*  --  246*  BUN 7  --  12  CREATININE  0.98 1.08 1.01  CALCIUM 8.9  --  8.6*   Liver Function Tests Recent Labs    01/23/23 1500  AST 24  ALT 30  ALKPHOS 84  BILITOT 0.8  PROT 6.6  ALBUMIN 3.7   No results for input(s): "LIPASE", "AMYLASE" in the last 72 hours. High Sensitivity Troponin:   Recent Labs  Lab 01/23/23 1441 01/23/23 1719  TROPONINIHS 2 2    BNP Invalid input(s): "POCBNP" D-Dimer No results for input(s): "DDIMER" in the last 72 hours. Hemoglobin A1C Recent Labs    01/25/23 0255  HGBA1C 9.1*   Fasting Lipid Panel Recent Labs    01/25/23 0255  CHOL 191  HDL 35*  LDLCALC 127*  TRIG 146  CHOLHDL 5.5   Thyroid Function Tests No results for input(s): "TSH", "T4TOTAL", "T3FREE", "THYROIDAB" in the last 72 hours.  Invalid input(s): "FREET3" _____________  CARDIAC CATHETERIZATION  Result Date: 01/25/2023   Mid LAD lesion is 50% stenosed.   Mid RCA lesion is 25% stenosed.   Non-stenotic 2nd Mrg lesion was previously treated.   LV end diastolic pressure is moderately elevated. Nonobstructive CAD. Continued patency of stent in the LCx. LAD disease unchanged from prior Normal LV  function. Moderately elevated LVEDP Plan: continue medical therapy. Anticipate DC home today   ECHOCARDIOGRAM COMPLETE  Result Date: 01/25/2023    ECHOCARDIOGRAM REPORT   Patient Name:   Timothy [name]. Date of Exam: 01/25/2023 Medical Rec #:  [id number]     Height:       70.0 in Accession #:    [id number]    Weight:       252.4 lb Date of Birth:  12-Apr-1971     BSA:          2.304 m Patient Age:    51 years      BP:           123/79 mmHg Patient Gender: M             HR:           58 bpm. Exam Location:  Inpatient Procedure: 2D Echo, Cardiac Doppler and Color Doppler Indications:    Chest pain  History:        Patient has prior history of Echocardiogram examinations, most                 recent 05/22/2019. Previous Myocardial Infarction and CAD,                 Signs/Symptoms:Chest Pain; Risk Factors:Dyslipidemia,                 Hypertension and Former Smoker.  Sonographer:    Wallie Char Referring Phys: Laverda Page, B IMPRESSIONS  1. Left ventricular ejection fraction, by estimation, is 55 to 60%. The left ventricle has normal function. The left ventricle has no regional wall motion abnormalities. Left ventricular diastolic parameters were normal.  2. Right ventricular systolic function is normal. The right ventricular size is normal.  3. The mitral valve is normal in structure. No evidence of mitral valve regurgitation. No evidence of mitral stenosis.  4. The aortic valve is tricuspid. Aortic valve regurgitation is not visualized. Aortic valve sclerosis is present, with no evidence of aortic valve stenosis.  5. The inferior vena cava is normal in size with greater than 50% respiratory variability, suggesting right atrial pressure of 3 mmHg. FINDINGS  Left Ventricle: Left ventricular ejection fraction, by estimation, is 55 to 60%. The left ventricle has  normal function. The left ventricle has no regional wall motion abnormalities. The left ventricular internal cavity size was normal in size. There is  no  left ventricular hypertrophy. Left ventricular diastolic parameters were normal. Right Ventricle: The right ventricular size is normal. Right ventricular systolic function is normal. Left Atrium: Left atrial size was normal in size. Right Atrium: Right atrial size was normal in size. Pericardium: There is no evidence of pericardial effusion. Mitral Valve: The mitral valve is normal in structure. No evidence of mitral valve regurgitation. No evidence of mitral valve stenosis. MV peak gradient, 4.6 mmHg. The mean mitral valve gradient is 2.0 mmHg. Tricuspid Valve: The tricuspid valve is normal in structure. Tricuspid valve regurgitation is trivial. No evidence of tricuspid stenosis. Aortic Valve: The aortic valve is tricuspid. Aortic valve regurgitation is not visualized. Aortic valve sclerosis is present, with no evidence of aortic valve stenosis. Aortic valve mean gradient measures 4.0 mmHg. Aortic valve peak gradient measures 6.7  mmHg. Aortic valve area, by VTI measures 2.83 cm. Pulmonic Valve: The pulmonic valve was normal in structure. Pulmonic valve regurgitation is not visualized. No evidence of pulmonic stenosis. Aorta: The aortic root is normal in size and structure. Venous: The inferior vena cava is normal in size with greater than 50% respiratory variability, suggesting right atrial pressure of 3 mmHg. IAS/Shunts: No atrial level shunt detected by color flow Doppler.  LEFT VENTRICLE PLAX 2D LVIDd:         4.10 cm      Diastology LVIDs:         2.90 cm      LV e' medial:    7.43 cm/s LV PW:         0.90 cm      LV E/e' medial:  14.4 LV IVS:        0.90 cm      LV e' lateral:   10.20 cm/s LVOT diam:     2.20 cm      LV E/e' lateral: 10.5 LV SV:         82 LV SV Index:   36 LVOT Area:     3.80 cm  LV Volumes (MOD) LV vol d, MOD A2C: 71.1 ml LV vol d, MOD A4C: 102.0 ml LV vol s, MOD A2C: 33.5 ml LV vol s, MOD A4C: 47.9 ml LV SV MOD A2C:     37.6 ml LV SV MOD A4C:     102.0 ml LV SV MOD BP:      49.0 ml RIGHT  VENTRICLE             IVC RV Basal diam:  3.20 cm     IVC diam: 2.00 cm RV S prime:     10.90 cm/s TAPSE (M-mode): 2.2 cm LEFT ATRIUM             Index        RIGHT ATRIUM           Index LA diam:        3.40 cm 1.48 cm/m   RA Area:     13.80 cm LA Vol (A2C):   36.5 ml 15.84 ml/m  RA Volume:   32.20 ml  13.97 ml/m LA Vol (A4C):   39.6 ml 17.18 ml/m LA Biplane Vol: 38.6 ml 16.75 ml/m  AORTIC VALVE AV Area (Vmax):    2.64 cm AV Area (Vmean):   2.56 cm AV Area (VTI):     2.83 cm AV Vmax:  129.00 cm/s AV Vmean:          93.300 cm/s AV VTI:            0.289 m AV Peak Grad:      6.7 mmHg AV Mean Grad:      4.0 mmHg LVOT Vmax:         89.75 cm/s LVOT Vmean:        62.900 cm/s LVOT VTI:          0.216 m LVOT/AV VTI ratio: 0.75  AORTA Ao Root diam: 3.40 cm Ao Asc diam:  3.50 cm MITRAL VALVE MV Area (PHT): 4.06 cm     SHUNTS MV Area VTI:   2.64 cm     Systemic VTI:  0.22 m MV Peak grad:  4.6 mmHg     Systemic Diam: 2.20 cm MV Mean grad:  2.0 mmHg MV Vmax:       1.07 m/s MV Vmean:      58.5 cm/s MV Decel Time: 187 msec MV E velocity: 107.00 cm/s MV A velocity: 62.90 cm/s MV E/A ratio:  1.70 Olga Millers MD Electronically signed by Olga Millers MD Signature Date/Time: 01/25/2023/9:08:44 AM    Final    CT Angio Chest/Abd/Pel for Dissection W and/or Wo Contrast  Result Date: 01/23/2023 CLINICAL DATA:  Left chest pain and left arm pain EXAM: CT ANGIOGRAPHY CHEST, ABDOMEN AND PELVIS TECHNIQUE: Non-contrast CT of the chest was initially obtained. Multidetector CT imaging through the chest, abdomen and pelvis was performed using the standard protocol during bolus administration of intravenous contrast. Multiplanar reconstructed images and MIPs were obtained and reviewed to evaluate the vascular anatomy. RADIATION DOSE REDUCTION: This exam was performed according to the departmental dose-optimization program which includes automated exposure control, adjustment of the mA and/or kV according to patient size  and/or use of iterative reconstruction technique. CONTRAST:  OMNIPAQUE IOHEXOL 350 MG/ML SOLN COMPARISON:  None Available. FINDINGS: CTA CHEST FINDINGS Cardiovascular: Normal heart size. No pericardial effusion. No evidence of pulmonary embolus. Mild coronary artery calcifications. Mediastinum/Nodes: Esophagus and thyroid are unremarkable. No enlarged lymph nodes seen in the chest. Lungs/Pleura: Central airways are patent. No consolidation, pleural effusion or pneumothorax. Scattered bilateral solid pulmonary nodules, largest is a 6 mm nodule of the left lower lobe located on series 6, image 33. Musculoskeletal: No aggressive appearing osseous lesions. Review of the MIP images confirms the above findings. CTA ABDOMEN AND PELVIS FINDINGS VASCULAR Aorta: Normal caliber aorta without aneurysm, dissection, vasculitis or significant stenosis. Standard three-vessel aortic arch with no significant stenosis. Celiac: Patent without evidence of dissection or vasculitis. Moderate narrowing at the origin due to diaphragmatic compression. SMA: Patent without evidence of aneurysm, dissection, vasculitis or significant stenosis. Renals: Both renal arteries are patent without evidence of aneurysm, dissection, vasculitis, fibromuscular dysplasia or significant stenosis. IMA: Patent without evidence of aneurysm, dissection, vasculitis or significant stenosis. Inflow: Patent without evidence of aneurysm, dissection, vasculitis or significant stenosis. Veins: No obvious venous abnormality within the limitations of this arterial phase study. Review of the MIP images confirms the above findings. NON-VASCULAR Hepatobiliary: Tiny focal area of arterial hyperenhancement measuring 5 mm on series 5, image 91, likely a flash fill hemangioma or perfusional. No suspicious focal liver abnormality is seen. No gallstones, gallbladder wall thickening, or biliary dilatation. Pancreas: Unremarkable. No pancreatic ductal dilatation or surrounding  inflammatory changes. Spleen: Normal in size without focal abnormality. Adrenals/Urinary Tract: Bilateral adrenal glands are unremarkable. No hydronephrosis or nephrolithiasis. Bladder is decompressed. Stomach/Bowel: Stomach is  within normal limits. Appendix appears normal. No evidence of bowel wall thickening, distention, or inflammatory changes. Lymphatic: No enlarged lymph nodes seen in the chest. Reproductive: Prostate is unremarkable. Other: Small bilateral fat containing inguinal hernias. Musculoskeletal: No acute or significant osseous findings. Review of the MIP images confirms the above findings. IMPRESSION: 1. Normal caliber aorta with no evidence of acute aortic syndrome. 2. No evidence of pulmonary embolus. 3. No acute findings in the chest, abdomen or pelvis. 4. Scattered bilateral solid pulmonary nodules, largest is a 6 mm nodule of the left lower lobe. Non-contrast chest CT at 3-6 months is recommended. If the nodules are stable at time of repeat CT, then future CT at 18-24 months (from today's scan) is considered optional for low-risk patients, but is recommended for high-risk patients. This recommendation follows the consensus statement: Guidelines for Management of Incidental Pulmonary Nodules Detected on CT Images: From the Fleischner Society 2017; Radiology 2017; 284:228-243. 5. Mild coronary artery calcifications and aortic Atherosclerosis (ICD10-I70.0). Electronically Signed   By: Allegra Lai M.D.   On: 01/23/2023 16:34   CT Head Wo Contrast  Result Date: 01/23/2023 CLINICAL DATA:  Provided history: Headache, sudden, severe. Left arm numbness. Headache. EXAM: CT HEAD WITHOUT CONTRAST TECHNIQUE: Contiguous axial images were obtained from the base of the skull through the vertex without intravenous contrast. RADIATION DOSE REDUCTION: This exam was performed according to the departmental dose-optimization program which includes automated exposure control, adjustment of the mA and/or kV  according to patient size and/or use of iterative reconstruction technique. COMPARISON:  Brain MRI 01/03/2021.  Head CT 01/03/2021. FINDINGS: Brain: Cerebral volume is normal. There is no acute intracranial hemorrhage. No demarcated cortical infarct. No extra-axial fluid collection. No evidence of an intracranial mass. No midline shift. Vascular: No hyperdense vessel.  Atherosclerotic calcifications. Skull: No calvarial fracture or aggressive osseous lesion. Sinuses/Orbits: No mass or acute finding within the imaged orbits. Minimal mucosal thickening within the right maxillary sinus. 2.1 cm mucous retention cyst, and mild background mucosal thickening, within the left maxillary sinus. Other: There is periapical lucency surrounding the left maxillary second molar tooth. IMPRESSION: 1.  No evidence of an acute intracranial abnormality. 2. Paranasal sinus disease as described. There is periapical lucency surrounding the left maxillary second molar tooth, and the left maxillary sinus findings could reflect odontogenic sinusitis. Electronically Signed   By: Jackey Loge D.O.   On: 01/23/2023 16:33   DG Chest Portable 1 View  Result Date: 01/23/2023 CLINICAL DATA:  Chest pain EXAM: PORTABLE CHEST 1 VIEW COMPARISON:  01/03/2021 FINDINGS: No consolidation, pneumothorax or effusion. No edema. Normal cardiopericardial silhouette. Degenerative changes are seen along the spine. Film is under penetrated. The extreme right inferior costophrenic angle is clipped off the edge of the film. IMPRESSION: No acute cardiopulmonary disease Electronically Signed   By: Karen Kays M.D.   On: 01/23/2023 15:20   Disposition   Pt is being discharged home today in good condition.  Follow-up Plans & Appointments     Follow-up Information     Sharlene Dory, PA-C Follow up on 02/15/2023.   Specialty: Cardiology Why: at 10 am for your follow up appt with cardiology Contact information: [address] [place]  [address] [phone]         Irena Reichmann, DO Follow up.   Specialty: Family Medicine Why: Please follow up with your PCP regarding repeat chest CT imaging needed in the 3-6 months Contact information: [address] [place]  [place] [address] [phone]                 Discharge Medications   Allergies as of 01/25/2023       Reactions   Morphine And Codeine Hives   Bee Venom         Medication List     TAKE these medications    acetaminophen 325 MG tablet Commonly known as: TYLENOL Take 650 mg by mouth every 6 (six) hours as needed for mild pain, fever or headache.   allopurinol 300 MG tablet Commonly known as: ZYLOPRIM Take 300 mg by mouth daily.   amoxicillin-clavulanate 875-125 MG tablet Commonly known as: AUGMENTIN Take 1 tablet by mouth every 12 (twelve) hours for 3 days.   aspirin EC 81 MG tablet Take 1 tablet (81 mg total) by mouth daily. Swallow whole. Start taking on: January 26, 2023   atorvastatin 80 MG tablet Commonly known as: LIPITOR Take 1 tablet (80 mg total) by mouth daily.   calcium carbonate 500 MG chewable tablet Commonly known as: TUMS - dosed in mg elemental calcium Chew 1 tablet (200 mg of elemental calcium total) by mouth 3 (three) times daily as needed for indigestion or heartburn. What changed: how much to take   clopidogrel 75 MG tablet Commonly known as: PLAVIX Take 1 tablet (75 mg total) by mouth daily.   cyclobenzaprine 10 MG tablet Commonly known as: FLEXERIL Take 10-20 mg by mouth at bedtime as needed for muscle spasms.   ezetimibe 10 MG tablet Commonly known as: ZETIA Take 1 tablet (10 mg total) by mouth daily.   famotidine 20 MG tablet Commonly known as: PEPCID TAKE 1 TABLET BY MOUTH EVERY DAY   lisinopril 2.5 MG tablet Commonly known as: ZESTRIL TAKE 1 TABLET BY MOUTH EVERY DAY   Metoprolol Tartrate 37.5 MG Tabs Take 1 tablet (37.5 mg total) by mouth 2 (two) times daily. What changed:   medication strength how much to take   nitroGLYCERIN 0.4 MG SL tablet Commonly known as: Nitrostat Place 1 tablet (0.4 mg total) under the tongue every 5 (five) minutes as needed. What changed: reasons to take this   Ozempic (0.25 or 0.5 MG/DOSE) 2 MG/1.5ML Sopn Generic drug: Semaglutide(0.25 or 0.5MG /DOS) Inject 0.5 mg into the skin once a week.   Synjardy XR 12.10-998 MG Tb24 Generic drug: Empagliflozin-metFORMIN HCl ER Take 1 tablet by mouth daily.        Outstanding Labs/Studies   N/a   Duration of Discharge Encounter   Greater than 30 minutes including physician time.  Signed, Laverda Page, NP 01/25/2023, 3:21 PM    Patient seen and examined. Agree with assessment and plan.  Patient underwent cardiac catheterization this morning which showed a widely patent circumflex marginal stent.  There is 50% mid LAD and 25% mid RCA stenosis for which medical therapy is recommended.  There was moderate elevation of LVEDP.  Patient is currently pain-free.  His echo Doppler study has shown EF 55 to 60% without wall motion abnormalities.  There is mild aortic valve sclerosis.  Right radial artery access site is stable.  There is no JVD.  Lungs are clear.  There is no chest wall tenderness.  He does have central adiposity.  Right radial access site is stable.  There is no edema.  Patient will be discharged today with office visit follow-up in several weeks.  I discussed the importance of weight loss and increased exercise.  Remotely had smoked cigarettes but fortunately had discontinued  this habit.  He has noted a 6 mm left lobe nodule.  A 3 to 67-month follow-up noncontrast chest CT is recommended.   Lennette Bihari, MD, [facility] 01/25/2023 3:21 PM

## 2023-01-25 NOTE — Interval H&P Note (Signed)
History and Physical Interval Note:  01/25/2023 9:38 AM  Timothy [name].  has presented today for surgery, with the diagnosis of chest pain.  The various methods of treatment have been discussed with the patient and family. After consideration of risks, benefits and other options for treatment, the patient has consented to  Procedure(s): LEFT HEART CATH AND CORONARY ANGIOGRAPHY (N/A) as a surgical intervention.  The patient's history has been reviewed, patient examined, no change in status, stable for surgery.  I have reviewed the patient's chart and labs.  Questions were answered to the patient's satisfaction.    Cath Lab Visit (complete for each Cath Lab visit)  Clinical Evaluation Leading to the Procedure:   ACS: Yes.    Non-ACS:    Anginal Classification: CCS III  Anti-ischemic medical therapy: Minimal Therapy (1 class of medications)  Non-Invasive Test Results: No non-invasive testing performed  Prior CABG: No previous CABG       Theron Arista [facility] 01/25/2023 9:39 AM

## 2023-01-26 ENCOUNTER — Encounter (HOSPITAL_COMMUNITY): Payer: Self-pay | Admitting: Cardiology

## 2023-02-03 ENCOUNTER — Other Ambulatory Visit: Payer: Self-pay | Admitting: Interventional Cardiology

## 2023-02-03 DIAGNOSIS — I2510 Atherosclerotic heart disease of native coronary artery without angina pectoris: Secondary | ICD-10-CM

## 2023-02-03 DIAGNOSIS — I10 Essential (primary) hypertension: Secondary | ICD-10-CM

## 2023-02-15 ENCOUNTER — Ambulatory Visit: Payer: PRIVATE HEALTH INSURANCE | Admitting: Physician Assistant

## 2023-02-20 NOTE — Progress Notes (Unsigned)
Cardiology Office Note    Patient Name: Timothy [name]. Date of Encounter: 02/20/2023  Primary Care Provider:  Irena Reichmann, DO Primary Cardiologist:  Lance Muss, MD Primary Electrophysiologist: None   Past Medical History    Past Medical History:  Diagnosis Date   Acute MI, lateral wall (HCC) 05/21/2019   Coronary artery disease    Hyperlipidemia    Hypertension    S/P angioplasty with stent to OM2  DES 05/21/2019   STEMI (ST elevation myocardial infarction) (HCC) 05/21/2019   Tobacco use     History of Present Illness  Timothy [name]. is a 52 y.o. male with a PMH of CAD s/p inferior lateral STEMI 05/2019 with PCI/DES to 2nd OM lesion with 50% mid LAD,, HTN, HLD, DM type II, previous tobacco abuser who presents today for posthospital follow-up.  Timothy [name] seen initially in 05/2021 with acute onset of chest pain and was found to have inferior lateral STEMI that was treated with LHC with PCI/DES to second marginal lesion 99%, 50% stenosed mid LAD and 25% mid RCA treated medically.  He presented to the ED on 01/23/2023 with complaint of chest pain similar to his previous MI.  EKG shows sinus rhythm with TWI in lead III similar to previous tracing, at bedtime troponin were flat and CT angio was negative for PE.  Due to unstable angina patient underwent LHC that showed widely patent stent with 50% mid LAD and 25% mid RCA stenosis treated with medical therapy.  2D echo was completed showing EF of 55 to 60% with no RWMA and mild aortic valve sclerosis CT of the chest showed 6 mm left lobe nodule with recommendation of repeat CT in 3 to 6 months.  During today's visit the patient reports that he has been doing well but is currently having some fatigue and flulike symptoms.  He denies any fever and was advised to complete a COVID test when he gets home.  His blood pressure today is controlled at 120/74 and heart rate is 72 bpm.  He is euvolemic on examination today since his previous visit he  denies any chest pain or anginal equivalent.  He is compliant with his current medications and denies any adverse reactions.  During today's visit we discussed his most recent left heart cath and patient had no questions or concerns regarding his previous study..  Patient denies chest pain, palpitations, dyspnea, PND, orthopnea, nausea, vomiting, dizziness, syncope, edema, weight gain, or early satiety.   Review of Systems  Please see the history of present illness.    All other systems reviewed and are otherwise negative except as noted above.  Physical Exam    Wt Readings from Last 3 Encounters:  01/25/23 252 lb 6.8 oz (114.5 kg)  01/10/22 251 lb (113.9 kg)  01/03/21 266 lb 12.1 oz (121 kg)   FA:OZHYQ were no vitals filed for this visit.,There is no height or weight on file to calculate BMI. GEN: Well nourished, well developed in no acute distress Neck: No JVD; No carotid bruits Pulmonary: Clear to auscultation without rales, wheezing or rhonchi  Cardiovascular: Normal rate. Regular rhythm. Normal S1. Normal S2.   Murmurs: There is no murmur.  ABDOMEN: Soft, non-tender, non-distended EXTREMITIES:  No edema; No deformity   EKG/LABS/ Recent Cardiac Studies   ECG personally reviewed by me today -none completed today  Risk Assessment/Calculations:          Lab Results  Component Value Date   WBC 14.5 (H) 01/25/2023  HGB 15.2 01/25/2023   HCT 44.1 01/25/2023   MCV 84.6 01/25/2023   PLT 293 01/25/2023   Lab Results  Component Value Date   CREATININE 1.01 01/25/2023   BUN 12 01/25/2023   NA 135 01/25/2023   K 3.7 01/25/2023   CL 101 01/25/2023   CO2 23 01/25/2023   Lab Results  Component Value Date   CHOL 191 01/25/2023   HDL 35 (L) 01/25/2023   LDLCALC 127 (H) 01/25/2023   TRIG 146 01/25/2023   CHOLHDL 5.5 01/25/2023    Lab Results  Component Value Date   HGBA1C 9.1 (H) 01/25/2023   Assessment & Plan    1.  Coronary artery disease: -s/p inferior MI 2020 with  DES to 2 OM, 50% mid LAD and 25% mid RCA stenosis treated medically repeat LHC with patent stent. -Today patient reports no chest pain or anginal equivalent since previous visit. -Continue current GDMT with ASA 81 mg, Lipitor 80 mg daily, Plavix 75 mg daily, ezetimibe 10 mg daily, metoprolol 37.5 mg twice daily, as needed Nitrostat 0.4 mg -Patient was advised to increase physical activity to at least 150 minutes/week  2.  Essential hypertension: -Patient's blood pressure today was well-controlled at 120/74 -Continue Toprol 37.5 mg twice daily  3.  Hyperlipidemia: -Patient's LDL cholesterol was 34 -Continue ezetimibe 10 mg daily, Lipitor 80 mg daily  4.  DM type II: -Patient's last  A1C was 4.5 -Continue current treatment plan per PCP  5.  Pulmonary nodules: -Patient had referral placed to [facility] pulmonology for follow-up of pulmonary nodule seen during recent ED visit.   Disposition: Follow-up with Lance Muss, MD or APP in 3 months   Signed, Napoleon Form, Leodis Rains, NP 02/20/2023, 6:40 PM [facility]

## 2023-02-21 ENCOUNTER — Encounter: Payer: Self-pay | Admitting: Nurse Practitioner

## 2023-02-21 ENCOUNTER — Ambulatory Visit: Payer: PRIVATE HEALTH INSURANCE | Attending: Physician Assistant | Admitting: Nurse Practitioner

## 2023-02-21 VITALS — BP 120/74 | HR 72 | Ht 70.0 in | Wt 258.6 lb

## 2023-02-21 DIAGNOSIS — I2510 Atherosclerotic heart disease of native coronary artery without angina pectoris: Secondary | ICD-10-CM | POA: Diagnosis not present

## 2023-02-21 DIAGNOSIS — I10 Essential (primary) hypertension: Secondary | ICD-10-CM

## 2023-02-21 DIAGNOSIS — E782 Mixed hyperlipidemia: Secondary | ICD-10-CM | POA: Diagnosis not present

## 2023-02-21 DIAGNOSIS — Z7984 Long term (current) use of oral hypoglycemic drugs: Secondary | ICD-10-CM

## 2023-02-21 DIAGNOSIS — E1159 Type 2 diabetes mellitus with other circulatory complications: Secondary | ICD-10-CM | POA: Diagnosis not present

## 2023-02-21 DIAGNOSIS — R918 Other nonspecific abnormal finding of lung field: Secondary | ICD-10-CM

## 2023-02-21 MED ORDER — LISINOPRIL 2.5 MG PO TABS: 2.5000 mg | ORAL_TABLET | Freq: Every day | ORAL | 3 refills | Status: AC

## 2023-02-21 MED ORDER — NITROGLYCERIN 0.4 MG SL SUBL: 0.4000 mg | SUBLINGUAL_TABLET | SUBLINGUAL | 3 refills | Status: AC | PRN

## 2023-02-21 MED ORDER — METOPROLOL TARTRATE 37.5 MG PO TABS: 37.5000 mg | ORAL_TABLET | Freq: Two times a day (BID) | ORAL | 3 refills | Status: DC

## 2023-02-21 MED ORDER — ATORVASTATIN CALCIUM 80 MG PO TABS: 80.0000 mg | ORAL_TABLET | Freq: Every day | ORAL | 3 refills | Status: DC

## 2023-02-21 MED ORDER — CLOPIDOGREL BISULFATE 75 MG PO TABS: 75.0000 mg | ORAL_TABLET | Freq: Every day | ORAL | 3 refills | Status: DC

## 2023-02-21 MED ORDER — EZETIMIBE 10 MG PO TABS: 10.0000 mg | ORAL_TABLET | Freq: Every day | ORAL | 3 refills | Status: DC

## 2023-02-21 NOTE — Patient Instructions (Addendum)
Medication Instructions:  Your physician recommends that you continue on your current medications as directed. Please refer to the Current Medication list given to you today. *If you need a refill on your cardiac medications before your next appointment, please call your pharmacy*   Lab Work: None Ordered   Testing/Procedures: None ordered   Follow-Up: At [facility], you and your health needs are our priority.  As part of our continuing mission to provide you with exceptional heart care, we have created designated Provider Care Teams.  These Care Teams include your primary Cardiologist (physician) and Advanced Practice Providers (APPs -  Physician Assistants and Nurse Practitioners) who all work together to provide you with the care you need, when you need it.  We recommend signing up for the patient portal called "MyChart".  Sign up information is provided on this After Visit Summary.  MyChart is used to connect with patients for Virtual Visits (Telemedicine).  Patients are able to view lab/test results, encounter notes, upcoming appointments, etc.  Non-urgent messages can be sent to your provider as well.   To learn more about what you can do with MyChart, go to [email or web address].    Your next appointment:   3 month(s)  Provider:   Dr Jacinto Halim Dr Odis Hollingshead Dr Rosemary Holms  Other Instructions  You have been referred to [facility] Pulmonology

## 2023-04-17 ENCOUNTER — Ambulatory Visit: Payer: BC Managed Care – PPO | Admitting: Interventional Cardiology

## 2023-04-28 ENCOUNTER — Other Ambulatory Visit: Payer: Self-pay | Admitting: Family Medicine

## 2023-04-28 ENCOUNTER — Encounter: Payer: Self-pay | Admitting: Family Medicine

## 2023-04-28 DIAGNOSIS — R911 Solitary pulmonary nodule: Secondary | ICD-10-CM

## 2023-05-02 ENCOUNTER — Ambulatory Visit
Admission: RE | Admit: 2023-05-02 | Discharge: 2023-05-02 | Disposition: A | Discharge status: Home / Self Care | Payer: BLUE CROSS/BLUE SHIELD | Source: Ambulatory Visit | Attending: Family Medicine | Admitting: Family Medicine

## 2023-05-02 DIAGNOSIS — R911 Solitary pulmonary nodule: Secondary | ICD-10-CM

## 2023-05-17 ENCOUNTER — Other Ambulatory Visit: Payer: Self-pay | Admitting: Interventional Cardiology

## 2023-05-17 DIAGNOSIS — I10 Essential (primary) hypertension: Secondary | ICD-10-CM

## 2023-05-17 DIAGNOSIS — I2510 Atherosclerotic heart disease of native coronary artery without angina pectoris: Secondary | ICD-10-CM

## 2023-09-25 ENCOUNTER — Telehealth: Payer: Self-pay | Admitting: *Deleted

## 2023-09-25 NOTE — Telephone Encounter (Signed)
   Name: Timothy Nienhuis Jr.  DOB: [birth year]  MRN: [id number]  Primary Cardiologist: Avery Bodo, MD   Preoperative team, please contact this patient and set up a phone call appointment for further preoperative risk assessment. Please obtain consent and complete medication review. Thank you for your help.  I confirm that guidance regarding antiplatelet and oral anticoagulation therapy has been completed and, if necessary, noted below.  Per office protocol, if patient is without any new symptoms or concerns at the time of their virtual visit, he may hold Plavix for 5 days prior to procedure. Please resume Plavix as soon as possible postprocedure, at the discretion of the surgeon.  He is to continue aspirin throughout the perioperative period.  I also confirmed the patient resides in the state of [place] . As per [facility] Medical Board telemedicine laws, the patient must reside in the state in which the provider is licensed.   Ava Boatman, NP 09/25/2023, 12:37 PM [facility]

## 2023-09-25 NOTE — Telephone Encounter (Signed)
 Left message to call back to schedule tele pre op appt.

## 2023-09-25 NOTE — Telephone Encounter (Signed)
   Pre-operative Risk Assessment    Patient Name: Timothy [name].  DOB: May 19, 1971 MRN: [id number]   Date of last office visit: 02/21/23 Charles Connor, NP Date of next office visit: NONE   Request for Surgical Clearance    Procedure:   COLONOSCOPY  Date of Surgery:  Clearance 10/03/23                                Surgeon:  DR. HUNG Surgeon's Group or Practice Name:  [facility] Phone number:  [phone] Fax number:  [phone]   Type of Clearance Requested:   - Medical  - Pharmacy:  Hold Clopidogrel (Plavix)     Type of Anesthesia:   PROPOFOL   Additional requests/questions:    Signed, Phylicia Mcgaugh   09/25/2023, 12:13 PM

## 2023-09-26 NOTE — Telephone Encounter (Signed)
Lvm to call back to make appointment.

## 2023-09-27 ENCOUNTER — Telehealth: Payer: Self-pay | Admitting: *Deleted

## 2023-09-27 NOTE — Telephone Encounter (Signed)
 Pt returning call to set up phone visit. Please advise

## 2023-09-27 NOTE — Telephone Encounter (Signed)
 Pt has been scheduled tele preop appt 09/29/23. Med rec and consent are done. Pt made aware to not tale Plavix the morning of 09/29/23 as this will begin the 5 day hold time.     Patient Consent for Virtual Visit        Timothy Andreus Cure. has provided verbal consent on 09/27/2023 for a virtual visit (video or telephone).   CONSENT FOR VIRTUAL VISIT FOR:  Timothy [name].  By participating in this virtual visit I agree to the following:  I hereby voluntarily request, consent and authorize [facility] and its employed or contracted physicians, physician assistants, nurse practitioners or other licensed health care professionals (the Practitioner), to provide me with telemedicine health care services (the "Services") as deemed necessary by the treating Practitioner. I acknowledge and consent to receive the Services by the Practitioner via telemedicine. I understand that the telemedicine visit will involve communicating with the Practitioner through live audiovisual communication technology and the disclosure of certain medical information by electronic transmission. I acknowledge that I have been given the opportunity to request an in-person assessment or other available alternative prior to the telemedicine visit and am voluntarily participating in the telemedicine visit.  I understand that I have the right to withhold or withdraw my consent to the use of telemedicine in the course of my care at any time, without affecting my right to future care or treatment, and that the Practitioner or I may terminate the telemedicine visit at any time. I understand that I have the right to inspect all information obtained and/or recorded in the course of the telemedicine visit and may receive copies of available information for a reasonable fee.  I understand that some of the potential risks of receiving the Services via telemedicine include:  Delay or interruption in medical evaluation due to technological  equipment failure or disruption; Information transmitted may not be sufficient (e.g. poor resolution of images) to allow for appropriate medical decision making by the Practitioner; and/or  In rare instances, security protocols could fail, causing a breach of personal health information.  Furthermore, I acknowledge that it is my responsibility to provide information about my medical history, conditions and care that is complete and accurate to the best of my ability. I acknowledge that Practitioner's advice, recommendations, and/or decision may be based on factors not within their control, such as incomplete or inaccurate data provided by me or distortions of diagnostic images or specimens that may result from electronic transmissions. I understand that the practice of medicine is not an exact science and that Practitioner makes no warranties or guarantees regarding treatment outcomes. I acknowledge that a copy of this consent can be made available to me via my patient portal [facility] MyChart), or I can request a printed copy by calling the office of [facility].    I understand that my insurance will be billed for this visit.   I have read or had this consent read to me. I understand the contents of this consent, which adequately explains the benefits and risks of the Services being provided via telemedicine.  I have been provided ample opportunity to ask questions regarding this consent and the Services and have had my questions answered to my satisfaction. I give my informed consent for the services to be provided through the use of telemedicine in my medical care

## 2023-09-27 NOTE — Telephone Encounter (Signed)
 Pt has been scheduled tele preop appt 09/29/23. Med rec and consent are done. Pt made aware to not tale Plavix the morning of 09/29/23 as this will begin the 5 day hold time.

## 2023-09-29 ENCOUNTER — Ambulatory Visit: Payer: Self-pay | Attending: Nurse Practitioner

## 2023-09-29 DIAGNOSIS — Z01810 Encounter for preprocedural cardiovascular examination: Secondary | ICD-10-CM

## 2023-09-29 NOTE — Progress Notes (Signed)
 Virtual Visit via Telephone Note   Because of Timothy Seger Jr. co-morbid illnesses, he is at least at moderate risk for complications without adequate follow up.  This format is felt to be most appropriate for this patient at this time.  Due to technical limitations with video connection web designer), today's appointment will be conducted as an audio only telehealth visit, and Timothy Burrough Jr. verbally agreed to proceed in this manner.   All issues noted in this document were discussed and addressed.  No physical exam could be performed with this format.  Evaluation Performed:  Preoperative cardiovascular risk assessment _____________   Date:  09/29/2023   Patient ID:  Timothy Bosie Raddle., DOB [birth year], MRN [id number] Patient Location:  Home Provider location:   Office  Primary Care Provider:  Gerome Brunet, DO Primary Cardiologist:  Candyce Reek, MD  Chief Complaint / Patient Profile   53 y.o. y/o male with a h/o CAD s/p inferior MI 2020 with DES to OM, HLD, HTN, DM type II, pulmonary nodules who is pending colonoscopy and presents today for telephonic preoperative cardiovascular risk assessment.  History of Present Illness    Timothy Gesner. is a 53 y.o. male who presents via audio/video conferencing for a telehealth visit today.  Pt was last seen in cardiology clinic on 02/21/2023 by Jackee Alberts, NP. At that time [facility]. was doing well with some fatigue but denied any chest pain or shortness of breath.  The patient is now pending procedure as outlined above. Since his last visit, he has been doing well with no new cardiac complaints.  He is still working full-time and staying active with no difficulty in completing 4 METS of activity.  He is compliant with his current medications and denies any adverse reactions..  He denies chest pain, shortness of breath, lower extremity edema, fatigue, palpitations, melena, hematuria, hemoptysis, diaphoresis, weakness, presyncope, syncope, orthopnea,  and PND.    Past Medical History    Past Medical History:  Diagnosis Date   Acute MI, lateral wall (HCC) 05/21/2019   Coronary artery disease    Hyperlipidemia    Hypertension    S/P angioplasty with stent to OM2  DES 05/21/2019   STEMI (ST elevation myocardial infarction) (HCC) 05/21/2019   Tobacco use    Past Surgical History:  Procedure Laterality Date   CORONARY/GRAFT ACUTE MI REVASCULARIZATION N/A 05/21/2019   Procedure: Coronary/Graft Acute MI Revascularization;  Surgeon: Reek Candyce RAMAN, MD;  Location: [facility] INVASIVE CV LAB;  Service: Cardiovascular;  Laterality: N/A;   KNEE ARTHROSCOPY     LEFT HEART CATH AND CORONARY ANGIOGRAPHY N/A 05/21/2019   Procedure: LEFT HEART CATH AND CORONARY ANGIOGRAPHY;  Surgeon: Reek Candyce RAMAN, MD;  Location: [facility] INVASIVE CV LAB;  Service: Cardiovascular;  Laterality: N/A;   LEFT HEART CATH AND CORONARY ANGIOGRAPHY N/A 01/25/2023   Procedure: LEFT HEART CATH AND CORONARY ANGIOGRAPHY;  Surgeon: Jordan, Peter M, MD;  Location: [facility] INVASIVE CV LAB;  Service: Cardiovascular;  Laterality: N/A;   SHOULDER SURGERY      Allergies  Allergies  Allergen Reactions   Morphine And Codeine Hives   Bee Venom    Semaglutide     Other Reaction(s): abdominal pain and diarrhea    Home Medications    Prior to Admission medications   Medication Sig Start Date End Date Taking? Authorizing Provider  acetaminophen  (TYLENOL ) 325 MG tablet Take 650 mg by mouth every 6 (six) hours as needed for mild pain, fever or headache.  [provider]  allopurinol  (ZYLOPRIM ) 300 MG tablet Take 300 mg by mouth daily.  07/06/18   [provider]  aspirin  EC 81 MG tablet Take 1 tablet (81 mg total) by mouth daily. Swallow whole. 01/26/23   Henry Manuelita NOVAK, NP  atorvastatin  (LIPITOR ) 80 MG tablet Take 1 tablet (80 mg total) by mouth daily. 02/21/23   Wyn Jackee VEAR Mickey., NP  calcium  carbonate (TUMS - DOSED IN MG ELEMENTAL CALCIUM ) 500 MG chewable tablet  Chew 1 tablet (200 mg of elemental calcium  total) by mouth 3 (three) times daily as needed for indigestion or heartburn. 01/25/23   Henry Manuelita NOVAK, NP  clopidogrel  (PLAVIX ) 75 MG tablet Take 1 tablet (75 mg total) by mouth daily. 02/21/23   Wyn Jackee VEAR Mickey., NP  cyclobenzaprine (FLEXERIL) 10 MG tablet Take 10-20 mg by mouth at bedtime as needed for muscle spasms. 11/04/22   [provider]  diclofenac  (VOLTAREN ) 75 MG EC tablet Take 75 mg by mouth as needed. 09/29/21   [provider]  Empagliflozin -metFORMIN  HCl ER (SYNJARDY  XR) 12.10-998 MG TB24 Take 1 tablet by mouth daily. 09/10/21   [provider]  EPINEPHrine (EPIPEN 2-PAK) 0.3 mg/0.3 mL IJ SOAJ injection Inject 0.3 mg into the muscle as needed for anaphylaxis. 10/30/18   [provider]  ezetimibe  (ZETIA ) 10 MG tablet Take 1 tablet (10 mg total) by mouth daily. 02/21/23   Wyn Jackee VEAR Mickey., NP  famotidine  (PEPCID ) 20 MG tablet TAKE 1 TABLET BY MOUTH EVERY DAY Patient taking differently: Take 20 mg by mouth daily. 03/31/20   Ingold, Laura R, NP  glipiZIDE (GLUCOTROL XL) 10 MG 24 hr tablet Take 10 mg by mouth daily with breakfast. 01/30/23   [provider]  lisinopril  (ZESTRIL ) 2.5 MG tablet Take 1 tablet (2.5 mg total) by mouth daily. 02/21/23   Wyn Jackee VEAR Mickey., NP  Metoprolol  Tartrate 37.5 MG TABS Take 1 tablet (37.5 mg total) by mouth 2 (two) times daily. 02/21/23   Wyn Jackee VEAR Mickey., NP  nitroGLYCERIN  (NITROSTAT ) 0.4 MG SL tablet Place 1 tablet (0.4 mg total) under the tongue every 5 (five) minutes as needed for chest pain. 02/21/23   Wyn Jackee VEAR Mickey., NP  OZEMPIC, 0.25 OR 0.5 MG/DOSE, 2 MG/1.5ML SOPN Inject 0.5 mg into the skin once a week. Patient not taking: Reported on 09/27/2023 05/12/20   [provider]    Physical Exam    Vital Signs:  Timothy Bosie Mickey. does not have vital signs available for review today.  Given telephonic nature of communication, physical exam is  limited. AAOx3. NAD. Normal affect.  Speech and respirations are unlabored.  Accessory Clinical Findings    None  Assessment & Plan    1.  Preoperative Cardiovascular Risk Assessment: - Patient's RCRI score is 0.9%  The patient affirms he has been doing well without any new cardiac symptoms. They are able to achieve 7 METS without cardiac limitations. Therefore, based on ACC/AHA guidelines, the patient would be at acceptable risk for the planned procedure without further cardiovascular testing. The patient was advised that if he develops new symptoms prior to surgery to contact our office to arrange for a follow-up visit, and he verbalized understanding.   The patient was advised that if he develops new symptoms prior to surgery to contact our office to arrange for a follow-up visit, and he verbalized understanding.  Per protocol patient can hold Plavix  5 days prior to procedure and should restart postprocedure  when surgically safe and directed by gastroenterologist.  A copy of this note will be routed to requesting surgeon.  Time:   Today, I have spent 6 minutes with the patient with telehealth technology discussing medical history, symptoms, and management plan.     Wyn Raddle, Jackee Shove, NP  09/29/2023, 7:11 AM

## 2024-03-04 ENCOUNTER — Other Ambulatory Visit: Payer: Self-pay

## 2024-03-05 MED ORDER — CLOPIDOGREL BISULFATE 75 MG PO TABS: 75.0000 mg | ORAL_TABLET | Freq: Every day | ORAL | 0 refills | Status: DC

## 2024-04-05 ENCOUNTER — Other Ambulatory Visit: Payer: Self-pay | Admitting: Nurse Practitioner

## 2024-04-24 ENCOUNTER — Emergency Department (HOSPITAL_BASED_OUTPATIENT_CLINIC_OR_DEPARTMENT_OTHER)
Admission: EM | Admit: 2024-04-24 | Discharge: 2024-04-25 | Disposition: A | Discharge status: Home / Self Care | Payer: Worker's Compensation

## 2024-04-24 ENCOUNTER — Encounter (HOSPITAL_BASED_OUTPATIENT_CLINIC_OR_DEPARTMENT_OTHER): Payer: Self-pay

## 2024-04-24 ENCOUNTER — Emergency Department (HOSPITAL_BASED_OUTPATIENT_CLINIC_OR_DEPARTMENT_OTHER): Payer: Worker's Compensation

## 2024-04-24 DIAGNOSIS — Z7982 Long term (current) use of aspirin: Secondary | ICD-10-CM | POA: Diagnosis not present

## 2024-04-24 DIAGNOSIS — Z23 Encounter for immunization: Secondary | ICD-10-CM | POA: Insufficient documentation

## 2024-04-24 DIAGNOSIS — W230XXD Caught, crushed, jammed, or pinched between moving objects, subsequent encounter: Secondary | ICD-10-CM | POA: Insufficient documentation

## 2024-04-24 DIAGNOSIS — S68119A Complete traumatic metacarpophalangeal amputation of unspecified finger, initial encounter: Secondary | ICD-10-CM

## 2024-04-24 DIAGNOSIS — S68621A Partial traumatic transphalangeal amputation of left index finger, initial encounter: Secondary | ICD-10-CM

## 2024-04-24 DIAGNOSIS — S68121D Partial traumatic metacarpophalangeal amputation of left index finger, subsequent encounter: Secondary | ICD-10-CM | POA: Insufficient documentation

## 2024-04-24 DIAGNOSIS — Z7984 Long term (current) use of oral hypoglycemic drugs: Secondary | ICD-10-CM | POA: Insufficient documentation

## 2024-04-24 DIAGNOSIS — Y990 Civilian activity done for income or pay: Secondary | ICD-10-CM | POA: Diagnosis not present

## 2024-04-24 DIAGNOSIS — S68629D Partial traumatic transphalangeal amputation of unspecified finger, subsequent encounter: Secondary | ICD-10-CM

## 2024-04-24 DIAGNOSIS — S6992XD Unspecified injury of left wrist, hand and finger(s), subsequent encounter: Secondary | ICD-10-CM | POA: Diagnosis present

## 2024-04-24 MED ORDER — TETANUS-DIPHTH-ACELL PERTUSSIS 5-2-15.5 LF-MCG/0.5 IM SUSP
0.5000 mL | Freq: Once | INTRAMUSCULAR | Status: AC
  Administered 2024-04-24: 0.5 mL via INTRAMUSCULAR
  Filled 2024-04-24: qty 0.5

## 2024-04-24 MED ORDER — OXYCODONE-ACETAMINOPHEN 5-325 MG PO TABS
2.0000 | ORAL_TABLET | Freq: Once | ORAL | Status: AC
  Administered 2024-04-24: 2 via ORAL
  Filled 2024-04-24: qty 2

## 2024-04-24 MED ORDER — FENTANYL CITRATE (PF) 50 MCG/ML IJ SOSY
50.0000 ug | PREFILLED_SYRINGE | INTRAMUSCULAR | Status: DC | PRN
  Administered 2024-04-24: 50 ug via INTRAVENOUS
  Filled 2024-04-24: qty 1

## 2024-04-24 MED ORDER — CEPHALEXIN 500 MG PO CAPS: 500.0000 mg | ORAL_CAPSULE | Freq: Two times a day (BID) | ORAL | 0 refills | Status: DC

## 2024-04-24 MED ORDER — FENTANYL CITRATE (PF) 50 MCG/ML IJ SOSY
PREFILLED_SYRINGE | INTRAMUSCULAR | Status: AC
  Administered 2024-04-24: 50 ug
  Filled 2024-04-24: qty 1

## 2024-04-24 MED ORDER — LIDOCAINE HCL (PF) 1 % IJ SOLN
INTRAMUSCULAR | Status: AC
  Administered 2024-04-24: 5 mL
  Filled 2024-04-24: qty 5

## 2024-04-24 MED ORDER — CEFAZOLIN SODIUM-DEXTROSE 2-4 GM/100ML-% IV SOLN
2.0000 g | Freq: Once | INTRAVENOUS | Status: AC
  Administered 2024-04-24: 2 g via INTRAVENOUS
  Filled 2024-04-24: qty 100

## 2024-04-24 MED ORDER — OXYCODONE-ACETAMINOPHEN 5-325 MG PO TABS: 1.0000 | ORAL_TABLET | Freq: Four times a day (QID) | ORAL | 0 refills | Status: DC | PRN

## 2024-04-24 NOTE — Discharge Instructions (Addendum)
 Call Dr. Shari fist thing tomorrow.  He will see you either tomorrow or the next day to complete the procedure we discussed.  Take the antibiotics as prescribed (first dose tomorrow AM).   Percocet for breakthrough pain otherwise    Please use Tylenol  or ibuprofen for pain.  You may use 600 mg ibuprofen every 6 hours or 1000 mg of Tylenol  every 6 hours.  You may choose to alternate between the 2.  This would be most effective.  Not to exceed 4 g of Tylenol  within 24 hours.  Not to exceed 3200 mg ibuprofen 24 hours.

## 2024-04-24 NOTE — ED Triage Notes (Addendum)
 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

## 2024-04-25 ENCOUNTER — Encounter (HOSPITAL_COMMUNITY)
Admission: EM | Disposition: A | Discharge status: Home / Self Care | Payer: Self-pay | Source: Home / Self Care | Attending: Emergency Medicine

## 2024-04-25 ENCOUNTER — Emergency Department (HOSPITAL_COMMUNITY): Payer: Self-pay | Admitting: Anesthesiology

## 2024-04-25 ENCOUNTER — Encounter (HOSPITAL_COMMUNITY): Payer: Self-pay

## 2024-04-25 ENCOUNTER — Emergency Department (HOSPITAL_COMMUNITY)
Admission: EM | Admit: 2024-04-25 | Discharge: 2024-04-25 | Disposition: A | Discharge status: Home / Self Care | Payer: Self-pay | Attending: Emergency Medicine | Admitting: Emergency Medicine

## 2024-04-25 ENCOUNTER — Other Ambulatory Visit: Payer: Self-pay

## 2024-04-25 DIAGNOSIS — W231XXA Caught, crushed, jammed, or pinched between stationary objects, initial encounter: Secondary | ICD-10-CM | POA: Insufficient documentation

## 2024-04-25 DIAGNOSIS — I10 Essential (primary) hypertension: Secondary | ICD-10-CM

## 2024-04-25 DIAGNOSIS — I2510 Atherosclerotic heart disease of native coronary artery without angina pectoris: Secondary | ICD-10-CM

## 2024-04-25 DIAGNOSIS — S68120A Partial traumatic metacarpophalangeal amputation of right index finger, initial encounter: Secondary | ICD-10-CM

## 2024-04-25 DIAGNOSIS — Z7982 Long term (current) use of aspirin: Secondary | ICD-10-CM | POA: Insufficient documentation

## 2024-04-25 DIAGNOSIS — S68621A Partial traumatic transphalangeal amputation of left index finger, initial encounter: Secondary | ICD-10-CM | POA: Insufficient documentation

## 2024-04-25 DIAGNOSIS — S68629A Partial traumatic transphalangeal amputation of unspecified finger, initial encounter: Secondary | ICD-10-CM

## 2024-04-25 DIAGNOSIS — Y92513 Shop (commercial) as the place of occurrence of the external cause: Secondary | ICD-10-CM | POA: Insufficient documentation

## 2024-04-25 DIAGNOSIS — Y9389 Activity, other specified: Secondary | ICD-10-CM | POA: Insufficient documentation

## 2024-04-25 DIAGNOSIS — Z794 Long term (current) use of insulin: Secondary | ICD-10-CM | POA: Insufficient documentation

## 2024-04-25 DIAGNOSIS — Z87891 Personal history of nicotine dependence: Secondary | ICD-10-CM

## 2024-04-25 LAB — CBC WITH DIFFERENTIAL/PLATELET
Abs Immature Granulocytes: 0.04 K/uL (ref 0.00–0.07)
Basophils Absolute: 0 K/uL (ref 0.0–0.1)
Basophils Relative: 1 %
Eosinophils Absolute: 0.2 K/uL (ref 0.0–0.5)
Eosinophils Relative: 3 %
HCT: 45.7 % (ref 39.0–52.0)
Hemoglobin: 16.4 g/dL (ref 13.0–17.0)
Immature Granulocytes: 1 %
Lymphocytes Relative: 24 %
Lymphs Abs: 1.8 K/uL (ref 0.7–4.0)
MCH: 30.6 pg (ref 26.0–34.0)
MCHC: 35.9 g/dL (ref 30.0–36.0)
MCV: 85.3 fL (ref 80.0–100.0)
Monocytes Absolute: 0.7 K/uL (ref 0.1–1.0)
Monocytes Relative: 9 %
Neutro Abs: 4.6 K/uL (ref 1.7–7.7)
Neutrophils Relative %: 62 %
Platelets: 262 K/uL (ref 150–400)
RBC: 5.36 MIL/uL (ref 4.22–5.81)
RDW: 12 % (ref 11.5–15.5)
WBC: 7.3 K/uL (ref 4.0–10.5)
nRBC: 0 % (ref 0.0–0.2)

## 2024-04-25 LAB — BASIC METABOLIC PANEL WITH GFR
Anion gap: 9 (ref 5–15)
BUN: 11 mg/dL (ref 6–20)
CO2: 23 mmol/L (ref 22–32)
Calcium: 8.9 mg/dL (ref 8.9–10.3)
Chloride: 97 mmol/L — ABNORMAL LOW (ref 98–111)
Creatinine, Ser: 1.16 mg/dL (ref 0.61–1.24)
GFR, Estimated: >60 mL/min (ref >60)
Glucose, Bld: 448 mg/dL — ABNORMAL HIGH (ref 70–99)
Potassium: 4.4 mmol/L (ref 3.5–5.1)
Sodium: 129 mmol/L — ABNORMAL LOW (ref 135–145)

## 2024-04-25 LAB — GLUCOSE, CAPILLARY
Glucose-Capillary: 288 mg/dL — ABNORMAL HIGH (ref 70–99)
Glucose-Capillary: 289 mg/dL — ABNORMAL HIGH (ref 70–99)

## 2024-04-25 SURGERY — AMPUTATION, FINGER
Anesthesia: General | Laterality: Left

## 2024-04-25 MED ORDER — ACETAMINOPHEN 10 MG/ML IV SOLN
INTRAVENOUS | Status: AC
  Filled 2024-04-25: qty 100

## 2024-04-25 MED ORDER — OXYCODONE-ACETAMINOPHEN 5-325 MG PO TABS
2.0000 | ORAL_TABLET | Freq: Once | ORAL | Status: AC
  Administered 2024-04-25: 2 via ORAL
  Filled 2024-04-25: qty 2

## 2024-04-25 MED ORDER — LIDOCAINE HCL (PF) 1 % IJ SOLN
INTRAMUSCULAR | Status: AC
  Filled 2024-04-25: qty 5

## 2024-04-25 MED ORDER — PROPOFOL 10 MG/ML IV BOLUS
INTRAVENOUS | Status: AC
  Filled 2024-04-25: qty 20

## 2024-04-25 MED ORDER — SODIUM CHLORIDE 0.9 % IV SOLN: INTRAVENOUS | Status: DC | PRN

## 2024-04-25 MED ORDER — FENTANYL CITRATE (PF) 100 MCG/2ML IJ SOLN: 25.0000 ug | INTRAMUSCULAR | Status: DC | PRN

## 2024-04-25 MED ORDER — 0.9 % SODIUM CHLORIDE (POUR BTL) OPTIME
TOPICAL | Status: DC | PRN
  Administered 2024-04-25: 1000 mL

## 2024-04-25 MED ORDER — BUPIVACAINE HCL (PF) 0.25 % IJ SOLN
INTRAMUSCULAR | Status: AC
  Filled 2024-04-25: qty 30

## 2024-04-25 MED ORDER — ONDANSETRON HCL 4 MG/2ML IJ SOLN: 4.0000 mg | Freq: Once | INTRAMUSCULAR | Status: DC | PRN

## 2024-04-25 MED ORDER — ACETAMINOPHEN 10 MG/ML IV SOLN: 1000.0000 mg | Freq: Once | INTRAVENOUS | Status: DC | PRN

## 2024-04-25 MED ORDER — PROPOFOL 10 MG/ML IV BOLUS
INTRAVENOUS | Status: DC | PRN
  Administered 2024-04-25: 200 mg via INTRAVENOUS

## 2024-04-25 MED ORDER — BUPIVACAINE HCL 0.25 % IJ SOLN
INTRAMUSCULAR | Status: DC | PRN
  Administered 2024-04-25: 10 mL

## 2024-04-25 MED ORDER — HYDROCODONE-ACETAMINOPHEN 5-325 MG PO TABS
1.0000 | ORAL_TABLET | Freq: Four times a day (QID) | ORAL | 0 refills | Status: AC | PRN
Start: 2024-04-25 — End: ?

## 2024-04-25 MED ORDER — CEFAZOLIN SODIUM-DEXTROSE 2-4 GM/100ML-% IV SOLN
INTRAVENOUS | Status: AC
  Filled 2024-04-25: qty 100

## 2024-04-25 MED ORDER — OXYCODONE HCL 5 MG/5ML PO SOLN: 5.0000 mg | Freq: Once | ORAL | Status: DC | PRN

## 2024-04-25 MED ORDER — SULFAMETHOXAZOLE-TRIMETHOPRIM 800-160 MG PO TABS: 1.0000 | ORAL_TABLET | Freq: Two times a day (BID) | ORAL | 0 refills | Status: AC

## 2024-04-25 MED ORDER — SUCCINYLCHOLINE CHLORIDE 200 MG/10ML IV SOSY
PREFILLED_SYRINGE | INTRAVENOUS | Status: AC
  Filled 2024-04-25: qty 20

## 2024-04-25 MED ORDER — OXYCODONE HCL 5 MG PO TABS: 5.0000 mg | ORAL_TABLET | Freq: Once | ORAL | Status: DC | PRN

## 2024-04-25 MED ORDER — ROCURONIUM BROMIDE 10 MG/ML (PF) SYRINGE
PREFILLED_SYRINGE | INTRAVENOUS | Status: AC
  Filled 2024-04-25: qty 30

## 2024-04-25 MED ORDER — ONDANSETRON HCL 4 MG PO TABS
4.0000 mg | ORAL_TABLET | Freq: Once | ORAL | Status: AC
  Administered 2024-04-25: 4 mg via ORAL
  Filled 2024-04-25: qty 1

## 2024-04-25 MED ORDER — EPHEDRINE 5 MG/ML INJ
INTRAVENOUS | Status: AC
  Filled 2024-04-25: qty 10

## 2024-04-25 MED ORDER — MIDAZOLAM HCL 2 MG/2ML IJ SOLN
INTRAMUSCULAR | Status: AC
  Filled 2024-04-25: qty 2

## 2024-04-25 MED ORDER — MIDAZOLAM HCL (PF) 2 MG/2ML IJ SOLN
INTRAMUSCULAR | Status: DC | PRN
  Administered 2024-04-25: 2 mg via INTRAVENOUS

## 2024-04-25 MED ORDER — LACTATED RINGERS IV SOLN
INTRAVENOUS | Status: DC | PRN
Start: 2024-04-25 — End: 2024-04-25

## 2024-04-25 MED ORDER — LIDOCAINE HCL (PF) 1 % IJ SOLN
5.0000 mL | Freq: Once | INTRAMUSCULAR | Status: AC
  Administered 2024-04-25: 5 mL

## 2024-04-25 MED ORDER — SODIUM CHLORIDE 0.9 % IV BOLUS
1000.0000 mL | Freq: Once | INTRAVENOUS | Status: AC
  Administered 2024-04-25: 1000 mL via INTRAVENOUS

## 2024-04-25 MED ORDER — FENTANYL CITRATE (PF) 50 MCG/ML IJ SOSY
50.0000 ug | PREFILLED_SYRINGE | INTRAMUSCULAR | Status: DC | PRN
  Administered 2024-04-25: 50 ug via INTRAVENOUS
  Filled 2024-04-25: qty 1

## 2024-04-25 MED ORDER — INSULIN ASPART 100 UNIT/ML IJ SOLN
INTRAMUSCULAR | Status: DC | PRN
  Administered 2024-04-25: 10 [IU] via SUBCUTANEOUS

## 2024-04-25 MED ORDER — ONDANSETRON HCL 4 MG/2ML IJ SOLN
INTRAMUSCULAR | Status: DC | PRN
  Administered 2024-04-25: 4 mg via INTRAVENOUS

## 2024-04-25 MED ORDER — CEFAZOLIN SODIUM-DEXTROSE 2-3 GM-%(50ML) IV SOLR
INTRAVENOUS | Status: DC | PRN
  Administered 2024-04-25: 2 g via INTRAVENOUS

## 2024-04-25 MED ORDER — LIDOCAINE 2% (20 MG/ML) 5 ML SYRINGE
INTRAMUSCULAR | Status: AC
  Filled 2024-04-25: qty 10

## 2024-04-25 MED ORDER — FENTANYL CITRATE (PF) 100 MCG/2ML IJ SOLN
INTRAMUSCULAR | Status: DC | PRN
  Administered 2024-04-25: 50 ug via INTRAVENOUS

## 2024-04-25 MED ORDER — LIDOCAINE 2% (20 MG/ML) 5 ML SYRINGE
INTRAMUSCULAR | Status: DC | PRN
Start: 2024-04-25 — End: 2024-04-25
  Administered 2024-04-25: 100 mg via INTRAVENOUS

## 2024-04-25 MED ORDER — FENTANYL CITRATE (PF) 100 MCG/2ML IJ SOLN
INTRAMUSCULAR | Status: AC
  Filled 2024-04-25: qty 2

## 2024-04-25 MED ORDER — ESMOLOL HCL 100 MG/10ML IV SOLN
INTRAVENOUS | Status: AC
  Filled 2024-04-25: qty 10

## 2024-04-25 MED ORDER — PHENYLEPHRINE 80 MCG/ML (10ML) SYRINGE FOR IV PUSH (FOR BLOOD PRESSURE SUPPORT)
PREFILLED_SYRINGE | INTRAVENOUS | Status: AC
  Filled 2024-04-25: qty 30

## 2024-04-25 MED ORDER — ONDANSETRON HCL 4 MG/2ML IJ SOLN
INTRAMUSCULAR | Status: AC
  Filled 2024-04-25: qty 2

## 2024-04-25 MED ORDER — DEXMEDETOMIDINE HCL IN NACL 80 MCG/20ML IV SOLN
INTRAVENOUS | Status: AC
  Filled 2024-04-25: qty 40

## 2024-04-25 SURGICAL SUPPLY — 29 items
BAG COUNTER SPONGE SURGICOUNT (BAG) ×1 IMPLANT
BLADE LONG MED 31X9 (MISCELLANEOUS) ×1 IMPLANT
BNDG COHESIVE 1X5 TAN STRL LF (GAUZE/BANDAGES/DRESSINGS) ×1 IMPLANT
BNDG COMPR ESMARK 4X3 LF (GAUZE/BANDAGES/DRESSINGS) ×1 IMPLANT
BNDG GAUZE DERMACEA FLUFF 4 (GAUZE/BANDAGES/DRESSINGS) IMPLANT
CHLORAPREP W/TINT 26 (MISCELLANEOUS) ×1 IMPLANT
CORD BIPOLAR FORCEPS 12FT (ELECTRODE) ×1 IMPLANT
CUFF TOURN SGL QUICK 18X4 (TOURNIQUET CUFF) ×1 IMPLANT
CUFF TRNQT CYL 24X4X16.5-23 (TOURNIQUET CUFF) IMPLANT
GAUZE SPONGE 4X4 12PLY STRL (GAUZE/BANDAGES/DRESSINGS) IMPLANT
GAUZE XEROFORM 1X8 LF (GAUZE/BANDAGES/DRESSINGS) ×1 IMPLANT
GLOVE BIO SURGEON STRL SZ7.5 (GLOVE) ×1 IMPLANT
GLOVE BIOGEL PI IND STRL 8 (GLOVE) ×1 IMPLANT
GOWN STRL REUS W/ TWL LRG LVL3 (GOWN DISPOSABLE) ×1 IMPLANT
GOWN STRL REUS W/ TWL XL LVL3 (GOWN DISPOSABLE) ×1 IMPLANT
KIT BASIN OR (CUSTOM PROCEDURE TRAY) ×1 IMPLANT
KIT TURNOVER KIT B (KITS) ×1 IMPLANT
LOOP VASCLR MAXI BLUE 18IN ST (MISCELLANEOUS) IMPLANT
NDL HYPO 25GX1X1/2 BEV (NEEDLE) IMPLANT
NEEDLE HYPO 25GX1X1/2 BEV (NEEDLE) IMPLANT
PACK ORTHO EXTREMITY (CUSTOM PROCEDURE TRAY) ×1 IMPLANT
PAD ARMBOARD POSITIONER FOAM (MISCELLANEOUS) ×2 IMPLANT
PAD CAST 4YDX4 CTTN HI CHSV (CAST SUPPLIES) IMPLANT
SOLN 0.9% NACL POUR BTL 1000ML (IV SOLUTION) ×1 IMPLANT
SUT MON AB 5-0 PS2 18 (SUTURE) IMPLANT
SUT VIC AB 4-0 PS2 18 (SUTURE) IMPLANT
SYR CONTROL 10ML LL (SYRINGE) IMPLANT
TOWEL GREEN STERILE (TOWEL DISPOSABLE) ×1 IMPLANT
UNDERPAD 30X36 HEAVY ABSORB (UNDERPADS AND DIAPERS) ×1 IMPLANT

## 2024-04-25 NOTE — Op Note (Signed)
 NAME: Loretta Dalziel Jr. MEDICAL RECORD NO: [id number] DATE OF BIRTH: 1971-01-29 FACILITY: [facility] LOCATION: [facility] OR PHYSICIAN: Chalmers Iddings R. Sakinah Rosamond, MD   OPERATIVE REPORT   DATE OF PROCEDURE: 04/25/24    PREOPERATIVE DIAGNOSIS: Left index finger amputation   POSTOPERATIVE DIAGNOSIS: Left index finger amputation   PROCEDURE: Left index finger revision amputation through distal phalanx proximal to nail   SURGEON:  Franky Curia, M.D.   ASSISTANT: none   ANESTHESIA:  General   INTRAVENOUS FLUIDS:  Per anesthesia flow sheet.   ESTIMATED BLOOD LOSS:  Minimal.   COMPLICATIONS:  None.   SPECIMENS:  none   TOURNIQUET TIME:    Total Tourniquet Time Documented: Upper Arm (Left) - 29 minutes Total: Upper Arm (Left) - 29 minutes    DISPOSITION:  Stable to PACU.   INDICATIONS: 53 year old male states that yesterday at work he got the end of his left index finger pinched off in a urine chain.  Seen in the emergency department where the wound was cleaned and dressed.  He return to the emergency department today.  Recommended revision amputation in the OR.  Risks, benefits and alternatives of surgery were discussed including the risks of blood loss, infection, damage to nerves, vessels, tendons, ligaments, bone for surgery, need for additional surgery, complications with wound healing, continued pain, stiffness.  He voiced understanding of these risks and elected to proceed.  OPERATIVE COURSE:  After being identified preoperatively by myself,  the patient and I agreed on the procedure and site of the procedure.  The surgical site was marked.  Surgical consent had been signed. Preoperative IV antibiotic prophylaxis was given. He was transferred to the operating room and placed on the operating table in supine position with the left upper extremity on an arm board.  General anesthesia was induced by the anesthesiologist.  Left upper extremity was prepped and draped in normal sterile orthopedic fashion.  A  surgical pause was performed between the surgeons, anesthesia, and operating room staff and all were in agreement as to the patient, procedure, and site of procedure.  Tourniquet at the proximal aspect of the extremity was inflated to 250 mmHg after exsanguination of the arm with an Esmarch bandage.  The wound was explored.  There was no gross contamination.  There were 2 braided sutures which were removed.  Hematoma was removed.  There was fracture of the distal aspect of the distal phalanx.  There was only the base of the nailbed and nail fold remaining.  The wound was copiously irrigated with sterile saline.  The branched radial and ulnar digital nerve and artery were placed under traction bipolared and allowed to retract.  The soft tissues were mobilized around the distal phalanx.  The remaining nailbed and germinal matrix were sharply removed with the knife.  The extensor tendon insertion was preserved.  Rongeurs were used to shorten the bone removing fractured bone fragments.  The insertion of the flexor tendon was preserved.  The bone was shortened until soft tissues were able to be brought over the end of the bone.  The wound was then irrigated again with sterile saline.  5-0 Monocryl suture was used in an interrupted fashion to reapproximate soft tissues.  A vessel loop drain was placed in the wound.  Overall good contour to the end of the finger was achieved.  Digital block was performed with quarter percent plain Marcaine to aid in postoperative analgesia.  The wound was dressed with sterile Xeroform 4 x 4's and wrapped with  a Coban dressing lightly.  AlumaFoam splint was placed and wrapped lightly with Coban dressing.  The tourniquet was deflated at 29 minutes.  Fingertips were pink with brisk capillary refill after deflation of tourniquet.  The operative  drapes were broken down.  The patient was awoken from anesthesia safely.  He was transferred back to the stretcher and taken to PACU in stable  condition.  I will see him back in the office in 1 week for postoperative followup.  I will give him a prescription for Norco 5/325 1 tab PO q6 hours prn pain, dispense #15 and Bactrim DS 1 p.o. twice daily x 7 days.   Lylian Sanagustin, MD Electronically signed, 04/25/24

## 2024-04-25 NOTE — ED Provider Notes (Signed)
 [facility] Provider Note   CSN: [id number] Arrival date & time: 04/25/24  1709     Patient presents with: Finger Injury   Timothy [name]. is a 53 y.o. male.   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 Finger tip was completely severed. There is a tiny piece of nailbed left otherwise nailbed completely removed (w avulsion).   Bleeding controlled with pressure however patient is complaining of significant pain in fingertip with touch.      Prior to Admission medications   Medication Sig Start Date End Date Taking? Authorizing Provider  acetaminophen  (TYLENOL ) 325 MG tablet Take 650 mg by mouth every 6 (six) hours as needed for mild pain, fever or headache.    [provider]  allopurinol  (ZYLOPRIM ) 300 MG tablet Take 300 mg by mouth daily.  07/06/18   [provider]  aspirin  EC 81 MG tablet Take 1 tablet (81 mg total) by mouth daily. Swallow whole. 01/26/23   Henry Manuelita NOVAK, NP  atorvastatin  (LIPITOR ) 80 MG tablet Take 1 tablet (80 mg total) by mouth daily. 02/21/23   Wyn Jackee VEAR Raddle., NP  calcium  carbonate (TUMS - DOSED IN MG ELEMENTAL CALCIUM ) 500 MG chewable tablet Chew 1 tablet (200 mg of elemental calcium  total) by mouth 3 (three) times daily as needed for indigestion or heartburn. 01/25/23   Henry Manuelita NOVAK, NP  cephALEXin (KEFLEX) 500 MG capsule Take 1 capsule (500 mg total) by mouth 2 (two) times daily for 5 days. 04/24/24 04/29/24  Neldon Hamp RAMAN, PA  clopidogrel  (PLAVIX ) 75 MG tablet TAKE 1 TABLET BY MOUTH EVERY DAY 04/08/24   Lucien Orren SAILOR, PA-C  cyclobenzaprine (FLEXERIL) 10 MG tablet Take 10-20 mg by mouth at bedtime as needed for muscle spasms. 11/04/22   [provider]  diclofenac  (VOLTAREN ) 75 MG EC tablet Take 75 mg by mouth as needed. 09/29/21   [provider]  Empagliflozin -metFORMIN  HCl ER (SYNJARDY  XR) 12.10-998 MG  TB24 Take 1 tablet by mouth daily. 09/10/21   [provider]  EPINEPHrine (EPIPEN 2-PAK) 0.3 mg/0.3 mL IJ SOAJ injection Inject 0.3 mg into the muscle as needed for anaphylaxis. 10/30/18   [provider]  ezetimibe  (ZETIA ) 10 MG tablet Take 1 tablet (10 mg total) by mouth daily. 02/21/23   Wyn Jackee VEAR Raddle., NP  famotidine  (PEPCID ) 20 MG tablet TAKE 1 TABLET BY MOUTH EVERY DAY Patient taking differently: Take 20 mg by mouth daily. 03/31/20   Ingold, Laura R, NP  glipiZIDE (GLUCOTROL XL) 10 MG 24 hr tablet Take 10 mg by mouth daily with breakfast. 01/30/23   [provider]  lisinopril  (ZESTRIL ) 2.5 MG tablet Take 1 tablet (2.5 mg total) by mouth daily. 02/21/23   Wyn Jackee VEAR Raddle., NP  Metoprolol  Tartrate 37.5 MG TABS Take 1 tablet (37.5 mg total) by mouth 2 (two) times daily. 02/21/23   Wyn Jackee VEAR Raddle., NP  nitroGLYCERIN  (NITROSTAT ) 0.4 MG SL tablet Place 1 tablet (0.4 mg total) under the tongue every 5 (five) minutes as needed for chest pain. 02/21/23   Wyn Jackee VEAR Raddle., NP  oxyCODONE-acetaminophen  (PERCOCET/ROXICET) 5-325 MG tablet Take 1 tablet by mouth every 6 (six) hours as needed for severe pain (pain score 7-10). 04/24/24   Diante Barley S, PA  OZEMPIC, 0.25 OR 0.5 MG/DOSE, 2 MG/1.5ML SOPN Inject 0.5 mg into the skin once a week. Patient not taking:  Reported on 09/27/2023 05/12/20   [provider]    Allergies: Morphine and codeine, Bee venom, and Semaglutide    Review of Systems  Updated Vital Signs BP (!) 141/86   Pulse (!) 102   Temp 97.6 F (36.4 C)   Resp 18   Ht 5' 10 (1.778 m)   Wt 120.2 kg   SpO2 95%   BMI 38.02 kg/m   Physical Exam Vitals and nursing note reviewed.  Constitutional:      General: He is not in acute distress.    Appearance: Normal appearance. He is not ill-appearing.  HENT:     Head: Normocephalic and atraumatic.  Eyes:     General: No scleral icterus.       Right eye: No discharge.        Left eye: No  discharge.     Conjunctiva/sclera: Conjunctivae normal.  Pulmonary:     Effort: Pulmonary effort is normal.     Breath sounds: No stridor.  Musculoskeletal:     Comments: Personally updated left index finger amputated just distal to the beginning of the nailbed plate/bed.  Sensation intact in finger  Skin:    General: Skin is warm and dry.  Neurological:     Mental Status: He is alert and oriented to person, place, and time. Mental status is at baseline.          (all labs ordered are listed, but only abnormal results are displayed) Labs Reviewed  BASIC METABOLIC PANEL WITH GFR  CBC WITH DIFFERENTIAL/PLATELET    EKG: None  Radiology: DG Finger Index Left Result Date: 04/24/2024 EXAM: 3 VIEW(S) XRAY OF THE FINGER(S) 04/24/2024 10:46:00 PM COMPARISON: None available. CLINICAL HISTORY: amputation FINDINGS: BONES AND JOINTS: Status post second digit amputation at the level of the mid-distal phalanx. Fracture is favored to be mildly comminuted, although obscured by overlying gauze/dressing. No joint dislocation. SOFT TISSUES: Overlying gauze/dressing is present, obscuring the fracture. IMPRESSION: 1. Status post second digit amputation at the level of the mid-distal phalanx, as above. Electronically signed by: Pinkie Pebbles MD 04/24/2024 10:50 PM EST RP Workstation: [id number]     .Laceration Repair  Date/Time: 04/25/2024 7:17 PM  Performed by: Neldon Hamp RAMAN, PA Authorized by: Neldon Hamp RAMAN, PA   Consent:    Consent obtained:  Verbal   Consent given by:  Parent and patient   Risks discussed:  Infection, pain, poor cosmetic result and poor wound healing Universal protocol:    Procedure explained and questions answered to patient or proxy's satisfaction: yes     Relevant documents present and verified: yes     Test results available: yes     Imaging studies available: yes     Required blood products, implants, devices, and special equipment available: yes      Site/side marked: yes     Immediately prior to procedure, a time out was called: yes     Patient identity confirmed:  Verbally with patient and arm band Laceration details:    Location:  Finger   Finger location:  L index finger   Length (cm):  2.8 Exploration:    Hemostasis achieved with:  Epinephrine and tourniquet   Imaging obtained: x-ray     Imaging outcome: foreign body not noted     Wound extent: areolar tissue violated     Wound extent: fascia not violated, no foreign body and no signs of injury     Contaminated: no   Treatment:    Amount of  cleaning:  Standard   Irrigation solution:  Sterile saline   Irrigation volume:  Copious   Visualized foreign bodies/material removed: no   Skin repair:    Repair method:  Sutures   Suture size:  5-0   Wound skin closure material used: vicryl 5-0.   Suture technique:  Simple interrupted   Number of sutures:  2 Approximation:    Approximation:  Close Repair type:    Repair type:  Intermediate Post-procedure details:    Dressing:  Antibiotic ointment   Procedure completion:  Tolerated    Medications Ordered in the ED  oxyCODONE-acetaminophen  (PERCOCET/ROXICET) 5-325 MG per tablet 2 tablet (has no administration in time range)  ondansetron  (ZOFRAN ) tablet 4 mg (has no administration in time range)                                     Medical Decision Making  Traumatic amputation of distal left index fingertip.  Sensation intact able to move DIP PIP and MCP without difficulty.  No other injuries.  Discussed with hand surgery Dr. Ahmad who recommends follow-up tomorrow in clinic.  Given Unasyn, updated on Tdap, given analgesics and lidocaine  was used for digital block   I discussed this case with my attending physician who cosigned this note including patient's presenting symptoms, physical exam, and planned diagnostics and interventions. Attending physician stated agreement with plan or made changes to plan which were  implemented.   Attending physician assessed patient at bedside.  Xeroform bandage applied, Coban applied over bulky dressing.  Final diagnoses:  Partial traumatic amputation of finger through phalanx, initial encounter    ED Discharge Orders     None          Neldon Hamp RAMAN, [place] 04/25/24 1919    Neysa Caron PARAS, DO 04/25/24 2339

## 2024-04-25 NOTE — Discharge Instructions (Signed)

## 2024-04-25 NOTE — Anesthesia Postprocedure Evaluation (Signed)
 Anesthesia Post Note  Patient: Timothy [name].  Procedure(s) Performed: AMPUTATION, FINGER (Left)     Patient location during evaluation: PACU Anesthesia Type: General Level of consciousness: awake and alert Pain management: pain level controlled Vital Signs Assessment: post-procedure vital signs reviewed and stable Respiratory status: spontaneous breathing, nonlabored ventilation, respiratory function stable and patient connected to nasal cannula oxygen Cardiovascular status: blood pressure returned to baseline and stable Postop Assessment: no apparent nausea or vomiting Anesthetic complications: no   No notable events documented.  Last Vitals:  Vitals:   04/25/24 2245 04/25/24 2300  BP: (!) 123/90 119/79  Pulse: 88 81  Resp: 15 10  Temp:    SpO2: 97% 93%    Last Pain:  Vitals:   04/25/24 2300  TempSrc:   PainSc: 0-No pain                 Lynwood MARLA Cornea

## 2024-04-25 NOTE — H&P (Signed)
 Timothy Abbey Jr. is an 53 y.o. male.   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  Seen in ED where wound cleaned and dressed.  Returned to ED today.  Reports previous sprains to fingers and no other injury at this time.  Allergies:  Allergies  Allergen Reactions   Morphine And Codeine Hives   Bee Venom    Semaglutide     Other Reaction(s): abdominal pain and diarrhea    Past Medical History:  Diagnosis Date   Acute MI, lateral wall (HCC) 05/21/2019   Coronary artery disease    Hyperlipidemia    Hypertension    S/P angioplasty with stent to OM2  DES 05/21/2019   STEMI (ST elevation myocardial infarction) (HCC) 05/21/2019   Tobacco use     Past Surgical History:  Procedure Laterality Date   CORONARY/GRAFT ACUTE MI REVASCULARIZATION N/A 05/21/2019   Procedure: Coronary/Graft Acute MI Revascularization;  Surgeon: Dann Candyce RAMAN, MD;  Location: [facility] INVASIVE CV LAB;  Service: Cardiovascular;  Laterality: N/A;   KNEE ARTHROSCOPY     LEFT HEART CATH AND CORONARY ANGIOGRAPHY N/A 05/21/2019   Procedure: LEFT HEART CATH AND CORONARY ANGIOGRAPHY;  Surgeon: Dann Candyce RAMAN, MD;  Location: [facility] INVASIVE CV LAB;  Service: Cardiovascular;  Laterality: N/A;   LEFT HEART CATH AND CORONARY ANGIOGRAPHY N/A 01/25/2023   Procedure: LEFT HEART CATH AND CORONARY ANGIOGRAPHY;  Surgeon: Jordan, Peter M, MD;  Location: [facility] INVASIVE CV LAB;  Service: Cardiovascular;  Laterality: N/A;   SHOULDER SURGERY      Family History: Family History  Problem Relation Age of Onset   Diabetes Mother     Social History:   reports that he has quit smoking. His smoking use included cigarettes. His smokeless tobacco use includes snuff. He reports current alcohol use. He reports that he does not use drugs.  Medications: (Not in a hospital admission)   Results for orders placed or performed during the hospital  encounter of 04/25/24 (from the past 48 hours)  Basic metabolic panel     Status: Abnormal   Collection Time: 04/25/24  7:16 PM  Result Value Ref Range   Sodium 129 (L) 135 - 145 mmol/L   Potassium 4.4 3.5 - 5.1 mmol/L   Chloride 97 (L) 98 - 111 mmol/L   CO2 23 22 - 32 mmol/L   Glucose, Bld 448 (H) 70 - 99 mg/dL    Comment: Glucose reference range applies only to samples taken after fasting for at least 8 hours.   BUN 11 6 - 20 mg/dL   Creatinine, Ser 8.83 0.61 - 1.24 mg/dL   Calcium  8.9 8.9 - 10.3 mg/dL   GFR, Estimated >39 >39 mL/min    Comment: (NOTE) Calculated using the CKD-EPI Creatinine Equation (2021)    Anion gap 9 5 - 15    Comment: Performed at [facility], [address] [place], [place] [address]  CBC with Differential     Status: None   Collection Time: 04/25/24  7:16 PM  Result Value Ref Range   WBC 7.3 4.0 - 10.5 K/uL   RBC 5.36 4.22 - 5.81 MIL/uL   Hemoglobin 16.4 13.0 - 17.0 g/dL   HCT 54.2 60.9 - 47.9 %   MCV 85.3 80.0 - 100.0 fL   MCH 30.6 26.0 - 34.0 pg   MCHC 35.9 30.0 - 36.0 g/dL   RDW 12.0  11.5 - 15.5 %   Platelets 262 150 - 400 K/uL   nRBC 0.0 0.0 - 0.2 %   Neutrophils Relative % 62 %   Neutro Abs 4.6 1.7 - 7.7 K/uL   Lymphocytes Relative 24 %   Lymphs Abs 1.8 0.7 - 4.0 K/uL   Monocytes Relative 9 %   Monocytes Absolute 0.7 0.1 - 1.0 K/uL   Eosinophils Relative 3 %   Eosinophils Absolute 0.2 0.0 - 0.5 K/uL   Basophils Relative 1 %   Basophils Absolute 0.0 0.0 - 0.1 K/uL   Immature Granulocytes 1 %   Abs Immature Granulocytes 0.04 0.00 - 0.07 K/uL    Comment: Performed at [facility], [address] [place], [place] [address]    DG Finger Index Left Result Date: 04/24/2024 EXAM: 3 VIEW(S) XRAY OF THE FINGER(S) 04/24/2024 10:46:00 PM COMPARISON: None available. CLINICAL HISTORY: amputation FINDINGS: BONES AND JOINTS: Status post second digit amputation at the level of the mid-distal phalanx. Fracture is favored to be mildly  comminuted, although obscured by overlying gauze/dressing. No joint dislocation. SOFT TISSUES: Overlying gauze/dressing is present, obscuring the fracture. IMPRESSION: 1. Status post second digit amputation at the level of the mid-distal phalanx, as above. Electronically signed by: Pinkie Pebbles MD 04/24/2024 10:50 PM EST RP Workstation: [id number]      Blood pressure (!) 131/90, pulse 90, temperature 98 F (36.7 C), temperature source Oral, resp. rate 16, height 5' 10 (1.778 m), weight 120.2 kg, SpO2 99%.  General appearance: alert, cooperative, and appears stated age Head: Normocephalic, without obvious abnormality, atraumatic Neck: supple, symmetrical, trachea midline Extremities: Intact sensation and capillary refill all digits.  +epl/fpl/io.  Amputation of distal aspect left index finger through base of nail. Skin: Skin color, texture, turgor normal. No rashes or lesions Neurologic: Grossly normal Incision/Wound: as above  Assessment/Plan Left index finger amputation.  Recommend revision amputation in OR.  Risks, benefits and alternatives of surgery were discussed including risks of blood loss, infection, damage to nerves/vessels/tendons/ligament/bone, failure of surgery, need for additional surgery, complication with wound healing, stiffness, need for repeat irrigation and debridement, amputation.  He voiced understanding of these risks and elected to proceed.    Charletha Dalpe 04/25/2024, 9:31 PM

## 2024-04-25 NOTE — Transfer of Care (Signed)
 Immediate Anesthesia Transfer of Care Note  Patient: Markeem Karpf Jr.  Procedure(s) Performed: AMPUTATION, FINGER (Left)  Patient Location: PACU  Anesthesia Type:General  Level of Consciousness: awake and alert   Airway & Oxygen Therapy: Patient Spontanous Breathing and Patient connected to face mask oxygen  Post-op Assessment: Report given to RN and Post -op Vital signs reviewed and stable  Post vital signs: Reviewed and stable  Last Vitals:  Vitals Value Taken Time  BP 148/96 04/25/24 22:35  Temp    Pulse 91 04/25/24 22:40  Resp 21 04/25/24 22:40  SpO2 96 % 04/25/24 22:40  Vitals shown include unfiled device data.  Last Pain:  Vitals:   04/25/24 2043  TempSrc:   PainSc: 8          Complications: No notable events documented.

## 2024-04-25 NOTE — Anesthesia Procedure Notes (Signed)
 Procedure Name: LMA Insertion Date/Time: 04/25/2024 9:36 PM  Performed by: Roddie Grate, CRNAPre-anesthesia Checklist: Patient identified, Emergency Drugs available, Suction available and Patient being monitored Patient Re-evaluated:Patient Re-evaluated prior to induction Oxygen Delivery Method: Circle System Utilized Preoxygenation: Pre-oxygenation with 100% oxygen Induction Type: IV induction Ventilation: Mask ventilation without difficulty LMA: LMA inserted LMA Size: 5.0 Number of attempts: 1 Airway Equipment and Method: Bite block Placement Confirmation: positive ETCO2 Tube secured with: Tape Dental Injury: Teeth and Oropharynx as per pre-operative assessment

## 2024-04-25 NOTE — Anesthesia Preprocedure Evaluation (Signed)
 Anesthesia Evaluation  Patient identified by MRN, date of birth, ID band Patient awake    Reviewed: Allergy & Precautions, NPO status , Patient's Chart, lab work & pertinent test results, reviewed documented beta blocker date and time   History of Anesthesia Complications Negative for: history of anesthetic complications  Airway Mallampati: II  TM Distance: >3 FB     Dental  (+) Missing, Poor Dentition   Pulmonary neg COPD, former smoker   breath sounds clear to auscultation       Cardiovascular hypertension, (-) angina + CAD, + Past MI and + Cardiac Stents  (-) CABG and (-) Peripheral Vascular Disease  Rhythm:Regular Rate:Normal     Neuro/Psych neg Seizures    GI/Hepatic ,,,(+) neg Cirrhosis        Endo/Other  diabetes, Poorly Controlled, Type 2    Renal/GU Renal disease     Musculoskeletal   Abdominal   Peds  Hematology   Anesthesia Other Findings   Reproductive/Obstetrics                              Anesthesia Physical Anesthesia Plan  ASA: 3  Anesthesia Plan: General   Post-op Pain Management:    Induction: Intravenous  PONV Risk Score and Plan: 2 and Ondansetron   Airway Management Planned: LMA  Additional Equipment:   Intra-op Plan:   Post-operative Plan: Extubation in OR  Informed Consent: I have reviewed the patients History and Physical, chart, labs and discussed the procedure including the risks, benefits and alternatives for the proposed anesthesia with the patient or authorized representative who has indicated his/her understanding and acceptance.     Dental advisory given  Plan Discussed with: CRNA  Anesthesia Plan Comments:          Anesthesia Quick Evaluation

## 2024-04-25 NOTE — ED Provider Triage Note (Signed)
 Emergency Medicine Provider Triage Evaluation Note  Timothy [name]. , a 53 y.o. male  was evaluated in triage.  Pt complains of finger injury.  Seen yesterday for partial distal finger amputation of his index finger.  He was given Ancef and started Keflex today.  Patient was supposed to have surgery for finger amputation today however apparently Worker's Comp. would not cover it and he never went for surgical stabilization and is here tonight because he is in severe pain with an open fracture  Review of Systems  Positive: Open finger fracture with distal amputation Negative: Fever  Physical Exam  BP (!) 141/86   Pulse (!) 102   Temp 97.6 F (36.4 C)   Resp 18   Ht 5' 10 (1.778 m)   Wt 120.2 kg   SpO2 95%   BMI 38.02 kg/m  Gen:   Awake, no distress   Resp:  Normal effort  MSK:   Moves extremities without difficulty Other:    Medical Decision Making  Medically screening exam initiated at 7:10 PM.  Appropriate orders placed.  Timothy Mcfall Jr. was informed that the remainder of the evaluation will be completed by another provider, this initial triage assessment does not replace that evaluation, and the importance of remaining in the ED until their evaluation is complete.     Arloa Chroman, PA-C 04/25/24 1913

## 2024-04-25 NOTE — ED Triage Notes (Signed)
 Patient states that he had a partial amputation of his left index finger yesterday and was seen at [facility]. He states that they didn't do anything other than wrap it up. He states the bone is exposed under the dressing. He reports today he feel off and that he is having full body hot flashes. He is unsure of any drainage, he has not unwrapped his finger since last night. Feels like he is running a fever.

## 2024-04-25 NOTE — ED Notes (Signed)
 Pt changed into gown, pt belongings taken with family.

## 2024-04-26 ENCOUNTER — Encounter (HOSPITAL_COMMUNITY): Payer: Self-pay | Admitting: Orthopedic Surgery

## 2024-04-26 LAB — GLUCOSE, CAPILLARY: Glucose-Capillary: 331 mg/dL — ABNORMAL HIGH (ref 70–99)

## 2024-04-26 NOTE — ED Provider Notes (Signed)
 [place] [facility] Provider Note   CSN: [id number] Arrival date & time: 04/24/24  2144     Patient presents with: finger amputation   Timothy [name]. is a 53 y.o. male.   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 Finger tip was completely severed. There is a tiny piece of nailbed left otherwise nailbed completely removed (w avulsion).   Bleeding controlled with pressure however patient is complaining of significant pain in fingertip with touch.      Prior to Admission medications   Medication Sig Start Date End Date Taking? Authorizing Provider  allopurinol  (ZYLOPRIM ) 300 MG tablet Take 300 mg by mouth daily.  07/06/18   [provider]  aspirin  EC 81 MG tablet Take 1 tablet (81 mg total) by mouth daily. Swallow whole. 01/26/23   Henry Manuelita NOVAK, NP  atorvastatin  (LIPITOR ) 80 MG tablet Take 1 tablet (80 mg total) by mouth daily. 02/21/23   Wyn Jackee VEAR Raddle., NP  calcium  carbonate (TUMS - DOSED IN MG ELEMENTAL CALCIUM ) 500 MG chewable tablet Chew 1 tablet (200 mg of elemental calcium  total) by mouth 3 (three) times daily as needed for indigestion or heartburn. 01/25/23   Henry Manuelita NOVAK, NP  clopidogrel  (PLAVIX ) 75 MG tablet TAKE 1 TABLET BY MOUTH EVERY DAY 04/08/24   Lucien Orren SAILOR, PA-C  cyclobenzaprine (FLEXERIL) 10 MG tablet Take 10-20 mg by mouth at bedtime as needed for muscle spasms. 11/04/22   [provider]  diclofenac  (VOLTAREN ) 75 MG EC tablet Take 75 mg by mouth as needed. 09/29/21   [provider]  Empagliflozin -metFORMIN  HCl ER (SYNJARDY  XR) 12.10-998 MG TB24 Take 1 tablet by mouth daily. 09/10/21   [provider]  EPINEPHrine (EPIPEN 2-PAK) 0.3 mg/0.3 mL IJ SOAJ injection Inject 0.3 mg into the muscle as needed for anaphylaxis. 10/30/18   [provider]  ezetimibe  (ZETIA ) 10 MG tablet Take 1 tablet (10 mg total) by mouth  daily. 02/21/23   Wyn Jackee VEAR Raddle., NP  famotidine  (PEPCID ) 20 MG tablet TAKE 1 TABLET BY MOUTH EVERY DAY Patient taking differently: Take 20 mg by mouth daily. 03/31/20   Ingold, Laura R, NP  glipiZIDE (GLUCOTROL XL) 10 MG 24 hr tablet Take 10 mg by mouth daily with breakfast. 01/30/23   [provider]  HYDROcodone -acetaminophen  (NORCO/VICODIN) 5-325 MG tablet Take 1 tablet by mouth every 6 (six) hours as needed for moderate pain (pain score 4-6). 04/25/24   Kuzma, Kevin, MD  lisinopril  (ZESTRIL ) 2.5 MG tablet Take 1 tablet (2.5 mg total) by mouth daily. 02/21/23   Wyn Jackee VEAR Raddle., NP  Metoprolol  Tartrate 37.5 MG TABS Take 1 tablet (37.5 mg total) by mouth 2 (two) times daily. 02/21/23   Wyn Jackee VEAR Raddle., NP  nitroGLYCERIN  (NITROSTAT ) 0.4 MG SL tablet Place 1 tablet (0.4 mg total) under the tongue every 5 (five) minutes as needed for chest pain. 02/21/23   Wyn Jackee VEAR Raddle., NP  OZEMPIC, 0.25 OR 0.5 MG/DOSE, 2 MG/1.5ML SOPN Inject 0.5 mg into the skin once a week. Patient not taking: Reported on 09/27/2023 05/12/20   [provider]  sulfamethoxazole-trimethoprim (BACTRIM DS) 800-160 MG tablet Take 1 tablet by mouth 2 (two) times daily. 04/25/24   Kuzma, Kevin, MD    Allergies: Morphine and codeine, Bee venom, and Semaglutide    Review of Systems  Updated Vital Signs BP 132/79 (BP Location: Right Arm)  Pulse 94   Temp 98 F (36.7 C) (Oral)   Resp 16   Ht 5' 10 (1.778 m)   Wt 121.6 kg   SpO2 98%   BMI 38.45 kg/m   Physical Exam Vitals and nursing note reviewed.  Constitutional:      General: He is not in acute distress.    Appearance: Normal appearance. He is not ill-appearing.  HENT:     Head: Normocephalic and atraumatic.  Eyes:     General: No scleral icterus.       Right eye: No discharge.        Left eye: No discharge.     Conjunctiva/sclera: Conjunctivae normal.  Pulmonary:     Effort: Pulmonary effort is normal.     Breath sounds: No stridor.   Musculoskeletal:     Comments: Personally updated left index finger amputated just distal to the beginning of the nailbed plate/bed.  Sensation intact in finger  Skin:    General: Skin is warm and dry.  Neurological:     Mental Status: He is alert and oriented to person, place, and time. Mental status is at baseline.          (all labs ordered are listed, but only abnormal results are displayed) Labs Reviewed - No data to display   EKG: None  Radiology: DG Finger Index Left Result Date: 04/24/2024 EXAM: 3 VIEW(S) XRAY OF THE FINGER(S) 04/24/2024 10:46:00 PM COMPARISON: None available. CLINICAL HISTORY: amputation FINDINGS: BONES AND JOINTS: Status post second digit amputation at the level of the mid-distal phalanx. Fracture is favored to be mildly comminuted, although obscured by overlying gauze/dressing. No joint dislocation. SOFT TISSUES: Overlying gauze/dressing is present, obscuring the fracture. IMPRESSION: 1. Status post second digit amputation at the level of the mid-distal phalanx, as above. Electronically signed by: Pinkie Pebbles MD 04/24/2024 10:50 PM EST RP Workstation: [id number]     Procedures   Medications Ordered in the ED  lidocaine  (PF) (XYLOCAINE ) 1 % injection (5 mLs  Given 04/24/24 2203)  fentaNYL  (SUBLIMAZE ) 50 MCG/ML injection (50 mcg  Given 04/24/24 2205)  ceFAZolin (ANCEF) IVPB 2g/100 mL premix (0 g Intravenous Stopped 04/25/24 0007)  oxyCODONE-acetaminophen  (PERCOCET/ROXICET) 5-325 MG per tablet 2 tablet (2 tablets Oral Given 04/24/24 2335)  Tdap (ADACEL) injection 0.5 mL (0.5 mLs Intramuscular Given 04/24/24 2336)  lidocaine  (PF) (XYLOCAINE ) 1 % injection 5 mL (5 mLs Other Given 04/25/24 0006)    Clinical Course as of 04/26/24 0024  Wed Apr 24, 2024  2335 Discussed with Dr. Ahmad who recommends follow-up with him in clinic tomorrow morning for Friday morning.  Keflex [WF]    Clinical Course User Index [WF] Neldon Hamp RAMAN, [place]                                   Medical Decision Making Amount and/or Complexity of Data Reviewed Radiology: ordered.  Risk Prescription drug management.   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  Sensation intact able to move DIP PIP and MCP without difficulty.  No other injuries.  Discussed with hand surgery Dr. Ahmad who recommends follow-up tomorrow in clinic.  Given Unasyn, updated on Tdap, given analgesics and lidocaine  was used for digital block   I discussed this case with my attending physician who cosigned this note including patient's presenting symptoms, physical exam, and planned diagnostics and interventions. Attending physician stated agreement with plan or made changes to plan  which were implemented.   Attending physician assessed patient at bedside.  Xeroform bandage applied, Coban applied over bulky dressing.  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

## 2024-04-26 NOTE — ED Provider Notes (Signed)
 [place] [facility] Provider Note   CSN: [id number] Arrival date & time: 04/24/24  2144     Patient presents with: finger amputation   Timothy [name]. is a 53 y.o. male.   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 Finger tip was completely severed. There is a tiny piece of nailbed left otherwise nailbed completely removed (w avulsion).   I saw this patient yesterday and he was to follow up with Dr. Shari this AM. Pt explains to me that there was some kind of disconnect between hand surgery clinic and the fact that it's a worker's comp case. He was not seen at hand surgery clinic.   Pt states hand is painful.       Prior to Admission medications   Medication Sig Start Date End Date Taking? Authorizing Provider  allopurinol  (ZYLOPRIM ) 300 MG tablet Take 300 mg by mouth daily.  07/06/18   [provider]  aspirin  EC 81 MG tablet Take 1 tablet (81 mg total) by mouth daily. Swallow whole. 01/26/23   Henry Manuelita NOVAK, NP  atorvastatin  (LIPITOR ) 80 MG tablet Take 1 tablet (80 mg total) by mouth daily. 02/21/23   Wyn Jackee VEAR Raddle., NP  calcium  carbonate (TUMS - DOSED IN MG ELEMENTAL CALCIUM ) 500 MG chewable tablet Chew 1 tablet (200 mg of elemental calcium  total) by mouth 3 (three) times daily as needed for indigestion or heartburn. 01/25/23   Henry Manuelita NOVAK, NP  clopidogrel  (PLAVIX ) 75 MG tablet TAKE 1 TABLET BY MOUTH EVERY DAY 04/08/24   Lucien Orren SAILOR, PA-C  cyclobenzaprine (FLEXERIL) 10 MG tablet Take 10-20 mg by mouth at bedtime as needed for muscle spasms. 11/04/22   [provider]  diclofenac  (VOLTAREN ) 75 MG EC tablet Take 75 mg by mouth as needed. 09/29/21   [provider]  Empagliflozin -metFORMIN  HCl ER (SYNJARDY  XR) 12.10-998 MG TB24 Take 1 tablet by mouth daily. 09/10/21   [provider]  EPINEPHrine (EPIPEN 2-PAK) 0.3 mg/0.3 mL IJ  SOAJ injection Inject 0.3 mg into the muscle as needed for anaphylaxis. 10/30/18   [provider]  ezetimibe  (ZETIA ) 10 MG tablet Take 1 tablet (10 mg total) by mouth daily. 02/21/23   Wyn Jackee VEAR Raddle., NP  famotidine  (PEPCID ) 20 MG tablet TAKE 1 TABLET BY MOUTH EVERY DAY Patient taking differently: Take 20 mg by mouth daily. 03/31/20   Ingold, Laura R, NP  glipiZIDE (GLUCOTROL XL) 10 MG 24 hr tablet Take 10 mg by mouth daily with breakfast. 01/30/23   [provider]  HYDROcodone -acetaminophen  (NORCO/VICODIN) 5-325 MG tablet Take 1 tablet by mouth every 6 (six) hours as needed for moderate pain (pain score 4-6). 04/25/24   Kuzma, Kevin, MD  lisinopril  (ZESTRIL ) 2.5 MG tablet Take 1 tablet (2.5 mg total) by mouth daily. 02/21/23   Wyn Jackee VEAR Raddle., NP  Metoprolol  Tartrate 37.5 MG TABS Take 1 tablet (37.5 mg total) by mouth 2 (two) times daily. 02/21/23   Wyn Jackee VEAR Raddle., NP  nitroGLYCERIN  (NITROSTAT ) 0.4 MG SL tablet Place 1 tablet (0.4 mg total) under the tongue every 5 (five) minutes as needed for chest pain. 02/21/23   Wyn Jackee VEAR Raddle., NP  OZEMPIC, 0.25 OR 0.5 MG/DOSE, 2 MG/1.5ML SOPN Inject 0.5 mg into the skin once a week. Patient not taking: Reported on 09/27/2023 05/12/20   [provider]  sulfamethoxazole-trimethoprim (BACTRIM DS) 800-160 MG tablet Take  1 tablet by mouth 2 (two) times daily. 04/25/24   Kuzma, Kevin, MD    Allergies: Morphine and codeine, Bee venom, and Semaglutide    Review of Systems  Updated Vital Signs BP 132/79 (BP Location: Right Arm)   Pulse 94   Temp 98 F (36.7 C) (Oral)   Resp 16   Ht 5' 10 (1.778 m)   Wt 121.6 kg   SpO2 98%   BMI 38.45 kg/m   Physical Exam Vitals and nursing note reviewed.  Constitutional:      General: He is not in acute distress.    Appearance: Normal appearance. He is not ill-appearing.  HENT:     Head: Normocephalic and atraumatic.  Eyes:     General: No scleral icterus.       Right eye: No  discharge.        Left eye: No discharge.     Conjunctiva/sclera: Conjunctivae normal.  Pulmonary:     Effort: Pulmonary effort is normal.     Breath sounds: No stridor.  Musculoskeletal:     Comments: Finger tip exposed -- appearance is similar to yesterday  Skin:    General: Skin is warm and dry.  Neurological:     Mental Status: He is alert and oriented to person, place, and time. Mental status is at baseline.    Pictuers from yesterday        (all labs ordered are listed, but only abnormal results are displayed) Labs Reviewed - No data to display   EKG: None  Radiology: DG Finger Index Left Result Date: 04/24/2024 EXAM: 3 VIEW(S) XRAY OF THE FINGER(S) 04/24/2024 10:46:00 PM COMPARISON: None available. CLINICAL HISTORY: amputation FINDINGS: BONES AND JOINTS: Status post second digit amputation at the level of the mid-distal phalanx. Fracture is favored to be mildly comminuted, although obscured by overlying gauze/dressing. No joint dislocation. SOFT TISSUES: Overlying gauze/dressing is present, obscuring the fracture. IMPRESSION: 1. Status post second digit amputation at the level of the mid-distal phalanx, as above. Electronically signed by: Pinkie Pebbles MD 04/24/2024 10:50 PM EST RP Workstation: [id number]     Procedures   Medications Ordered in the ED  lidocaine  (PF) (XYLOCAINE ) 1 % injection (5 mLs  Given 04/24/24 2203)  fentaNYL  (SUBLIMAZE ) 50 MCG/ML injection (50 mcg  Given 04/24/24 2205)  ceFAZolin (ANCEF) IVPB 2g/100 mL premix (0 g Intravenous Stopped 04/25/24 0007)  oxyCODONE-acetaminophen  (PERCOCET/ROXICET) 5-325 MG per tablet 2 tablet (2 tablets Oral Given 04/24/24 2335)  Tdap (ADACEL) injection 0.5 mL (0.5 mLs Intramuscular Given 04/24/24 2336)  lidocaine  (PF) (XYLOCAINE ) 1 % injection 5 mL (5 mLs Other Given 04/25/24 0006)    Clinical Course as of 04/26/24 0030  Wed Apr 24, 2024  2335 Discussed with Dr. Ahmad who recommends follow-up with him in  clinic tomorrow morning for Friday morning.  Keflex [WF]    Clinical Course User Index [WF] Neldon Hamp RAMAN, [place]                                  Medical Decision Making Amount and/or Complexity of Data Reviewed Radiology: ordered.  Risk Prescription drug management.   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  Sensation intact able to move DIP PIP and MCP without difficulty.  No other injuries.  Discussed with hand surgery Dr. Murrell who took patient to operating room.  Final diagnoses:  Partial traumatic amputation of finger through phalanx, subsequent encounter  Neldon Inoue Hubbard, [place] 04/26/24 SHARLYNE    Doretha Folks, MD 04/27/24 1538

## 2024-04-26 NOTE — ED Provider Notes (Signed)
 [place] [facility] Provider Note   CSN: [id number] Arrival date & time: 04/24/24  2144     Patient presents with: finger amputation   Timothy [name]. is a 53 y.o. male.   HPI  Expand All Collapse All   [place] [facility] Provider Note     CSN: [id number] Arrival date & time: 04/25/24  1709     Patient presents with: Finger Injury     Timothy [name]. is a 53 y.o. male.     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 Finger tip was completely severed. There is a tiny piece of nailbed left otherwise nailbed completely removed (w avulsion).    Bleeding controlled with pressure however patient is complaining of significant pain in fingertip with touch.              Prior to Admission medications   Medication Sig Start Date End Date Taking? Authorizing Provider  acetaminophen  (TYLENOL ) 325 MG tablet Take 650 mg by mouth every 6 (six) hours as needed for mild pain, fever or headache.       [provider]  allopurinol  (ZYLOPRIM ) 300 MG tablet Take 300 mg by mouth daily.  07/06/18     [provider]  aspirin  EC 81 MG tablet Take 1 tablet (81 mg total) by mouth daily. Swallow whole. 01/26/23     Henry Manuelita NOVAK, NP  atorvastatin  (LIPITOR ) 80 MG tablet Take 1 tablet (80 mg total) by mouth daily. 02/21/23     Wyn Jackee VEAR Raddle., NP  calcium  carbonate (TUMS - DOSED IN MG ELEMENTAL CALCIUM ) 500 MG chewable tablet Chew 1 tablet (200 mg of elemental calcium  total) by mouth 3 (three) times daily as needed for indigestion or heartburn. 01/25/23     Henry Manuelita NOVAK, NP  cephALEXin (KEFLEX) 500 MG capsule Take 1 capsule (500 mg total) by mouth 2 (two) times daily for 5 days. 04/24/24 04/29/24   Neldon Hamp RAMAN, PA  clopidogrel  (PLAVIX ) 75 MG tablet TAKE 1 TABLET BY MOUTH EVERY DAY 04/08/24     Lucien Orren SAILOR, PA-C  cyclobenzaprine  (FLEXERIL) 10 MG tablet Take 10-20 mg by mouth at bedtime as needed for muscle spasms. 11/04/22     [provider]  diclofenac  (VOLTAREN ) 75 MG EC tablet Take 75 mg by mouth as needed. 09/29/21     [provider]  Empagliflozin -metFORMIN  HCl ER (SYNJARDY  XR) 12.10-998 MG TB24 Take 1 tablet by mouth daily. 09/10/21     [provider]  EPINEPHrine (EPIPEN 2-PAK) 0.3 mg/0.3 mL IJ SOAJ injection Inject 0.3 mg into the muscle as needed for anaphylaxis. 10/30/18     [provider]  ezetimibe  (ZETIA ) 10 MG tablet Take 1 tablet (10 mg total) by mouth daily. 02/21/23     Wyn Jackee VEAR Raddle., NP  famotidine  (PEPCID ) 20 MG tablet TAKE 1 TABLET BY MOUTH EVERY DAY Patient taking differently: Take 20 mg by mouth daily. 03/31/20     Ingold, Laura R, NP  glipiZIDE (GLUCOTROL XL) 10 MG 24 hr tablet Take 10 mg by mouth daily with breakfast. 01/30/23     [provider]  lisinopril  (ZESTRIL ) 2.5 MG tablet Take 1 tablet (2.5 mg total) by mouth daily. 02/21/23     Wyn Jackee VEAR Raddle., NP  Metoprolol  Tartrate 37.5 MG TABS Take 1 tablet (37.5 mg total) by mouth  2 (two) times daily. 02/21/23     Wyn Jackee VEAR Mickey., NP  nitroGLYCERIN  (NITROSTAT ) 0.4 MG SL tablet Place 1 tablet (0.4 mg total) under the tongue every 5 (five) minutes as needed for chest pain. 02/21/23     Wyn Jackee VEAR Mickey., NP  oxyCODONE-acetaminophen  (PERCOCET/ROXICET) 5-325 MG tablet Take 1 tablet by mouth every 6 (six) hours as needed for severe pain (pain score 7-10). 04/24/24     Zenith Kercheval S, PA  OZEMPIC, 0.25 OR 0.5 MG/DOSE, 2 MG/1.5ML SOPN Inject 0.5 mg into the skin once a week. Patient not taking: Reported on 09/27/2023 05/12/20     [provider]      Allergies: Morphine and codeine, Bee venom, and Semaglutide     Review of Systems   Updated Vital Signs BP (!) 141/86   Pulse (!) 102   Temp 97.6 F (36.4 C)   Resp 18   Ht 5' 10 (1.778 m)   Wt 120.2 kg   SpO2 95%   BMI 38.02 kg/m     Physical Exam Vitals and nursing note reviewed.  Constitutional:      General: He is not in acute distress.    Appearance: Normal appearance. He is not ill-appearing.  HENT:     Head: Normocephalic and atraumatic.  Eyes:     General: No scleral icterus.       Right eye: No discharge.        Left eye: No discharge.     Conjunctiva/sclera: Conjunctivae normal.  Pulmonary:     Effort: Pulmonary effort is normal.     Breath sounds: No stridor.  Musculoskeletal:     Comments: Personally updated left index finger amputated just distal to the beginning of the nailbed plate/bed.  Sensation intact in finger  Skin:    General: Skin is warm and dry.  Neurological:     Mental Status: He is alert and oriented to person, place, and time. Mental status is at baseline.              (all labs ordered are listed, but only abnormal results are displayed) Labs Reviewed  BASIC METABOLIC PANEL WITH GFR  CBC WITH DIFFERENTIAL/PLATELET      EKG: None   Radiology:  Imaging Results (Last 48 hours)  DG Finger Index Left Result Date: 04/24/2024 EXAM: 3 VIEW(S) XRAY OF THE FINGER(S) 04/24/2024 10:46:00 PM COMPARISON: None available. CLINICAL HISTORY: amputation FINDINGS: BONES AND JOINTS: Status post second digit amputation at the level of the mid-distal phalanx. Fracture is favored to be mildly comminuted, although obscured by overlying gauze/dressing. No joint dislocation. SOFT TISSUES: Overlying gauze/dressing is present, obscuring the fracture. IMPRESSION: 1. Status post second digit amputation at the level of the mid-distal phalanx, as above. Electronically signed by: Pinkie Pebbles MD 04/24/2024 10:50 PM EST RP Workstation: [id number]         .Laceration Repair   Date/Time: 04/25/2024 7:17 PM   Performed by: Neldon Hamp RAMAN, PA Authorized by: Neldon Hamp RAMAN, PA   Consent:    Consent obtained:  Verbal   Consent given by:  Parent and patient   Risks discussed:  Infection, pain,  poor cosmetic result and poor wound healing Universal protocol:    Procedure explained and questions answered to patient or proxy's satisfaction: yes     Relevant documents present and verified: yes     Test results available: yes     Imaging studies available: yes     Required blood products, implants,  devices, and special equipment available: yes     Site/side marked: yes     Immediately prior to procedure, a time out was called: yes     Patient identity confirmed:  Verbally with patient and arm band Laceration details:    Location:  Finger   Finger location:  L index finger   Length (cm):  2.8 Exploration:    Hemostasis achieved with:  Epinephrine and tourniquet   Imaging obtained: x-ray     Imaging outcome: foreign body not noted     Wound extent: areolar tissue violated     Wound extent: fascia not violated, no foreign body and no signs of injury     Contaminated: no   Treatment:    Amount of cleaning:  Standard   Irrigation solution:  Sterile saline   Irrigation volume:  Copious   Visualized foreign bodies/material removed: no   Skin repair:    Repair method:  Sutures   Suture size:  5-0   Wound skin closure material used: vicryl 5-0.   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  Sensation intact able to move DIP PIP and MCP without difficulty.  No other injuries.   Discussed with hand surgery Dr. Ahmad who recommends follow-up tomorrow in  clinic.  Given Unasyn, updated on Tdap, given analgesics and lidocaine  was used for digital block     I discussed this case with my attending physician who cosigned this note including patient's presenting symptoms, physical exam, and planned diagnostics and interventions. Attending physician stated agreement with plan or made changes to plan which were implemented.    Attending physician assessed patient at bedside.   Xeroform bandage applied, Coban applied over bulky dressing.   Final diagnoses:  Partial traumatic amputation of finger through phalanx, initial encounter      ED Discharge Orders       None                Neldon Hamp RAMAN, [place] 04/26/24 0023    Neysa Caron PARAS, DO 04/26/24 2303

## 2024-05-29 ENCOUNTER — Other Ambulatory Visit: Payer: Self-pay | Admitting: Physician Assistant

## 2024-07-03 ENCOUNTER — Telehealth: Payer: Self-pay | Admitting: Internal Medicine

## 2024-07-03 NOTE — Telephone Encounter (Signed)
" °*  STAT* If patient is at the pharmacy, call can be transferred to refill team.   1. Which medications need to be refilled? (please list name of each medication and dose if known)   clopidogrel  (PLAVIX ) 75 MG tablet     2. Would you like to learn more about the convenience, safety, & potential cost savings by using the [facility]? No    3. Are you open to using the [facility] (Type [facility]. No    4. Which pharmacy/location (including street and city if local pharmacy) is medication to be sent to? [id number] - [place], [place] - [address]     5. Do they need a 30 day or 90 day supply? 90 day   Pt out of medication Pt scheduled 1/30  "

## 2024-07-09 MED ORDER — CLOPIDOGREL BISULFATE 75 MG PO TABS: 75.0000 mg | ORAL_TABLET | Freq: Every day | ORAL | 0 refills | Status: DC

## 2024-07-09 NOTE — Telephone Encounter (Signed)
 Pt scheduled to see Dr. Kriste 07/12/24, sent in 7 day supply

## 2024-07-12 ENCOUNTER — Encounter: Payer: Self-pay | Admitting: Internal Medicine

## 2024-07-12 ENCOUNTER — Ambulatory Visit: Payer: Self-pay | Attending: Internal Medicine | Admitting: Internal Medicine

## 2024-07-12 VITALS — BP 132/86 | HR 72 | Ht 70.0 in | Wt 261.0 lb

## 2024-07-12 DIAGNOSIS — I10 Essential (primary) hypertension: Secondary | ICD-10-CM | POA: Diagnosis not present

## 2024-07-12 DIAGNOSIS — R0600 Dyspnea, unspecified: Secondary | ICD-10-CM | POA: Diagnosis not present

## 2024-07-12 DIAGNOSIS — Z01810 Encounter for preprocedural cardiovascular examination: Secondary | ICD-10-CM

## 2024-07-12 DIAGNOSIS — I2510 Atherosclerotic heart disease of native coronary artery without angina pectoris: Secondary | ICD-10-CM

## 2024-07-12 LAB — LIPID PANEL
Chol/HDL Ratio: 5.3 ratio — ABNORMAL HIGH (ref 0.0–5.0)
Cholesterol, Total: 175 mg/dL (ref 100–199)
HDL: 33 mg/dL — ABNORMAL LOW
LDL Chol Calc (NIH): 116 mg/dL — ABNORMAL HIGH (ref 0–99)
Triglycerides: 144 mg/dL (ref 0–149)
VLDL Cholesterol Cal: 26 mg/dL (ref 5–40)

## 2024-07-12 MED ORDER — METOPROLOL SUCCINATE ER 50 MG PO TB24: 75.0000 mg | ORAL_TABLET | Freq: Every day | ORAL | 3 refills | Status: AC

## 2024-07-12 NOTE — Progress Notes (Signed)
 " Cardiology Office Note:  .   Date:  07/12/2024  ID:  Timothy [name]., DOB Mar 10, 1971, MRN [id number] PCP: Gerome Brunet, DO  [facility] Providers Cardiologist:  Candyce Reek, MD    History of Present Illness: .     Discussed the use of AI scribe software for clinical note transcription with the patient, who gave verbal consent to proceed.  History of Present Illness Timothy [name]. is a 54 year old male with coronary artery disease who presents for follow-up.  Coronary artery disease and anginal symptoms - History of inferior STEMI in 2020 treated with drug-eluting stent to the obtuse marginal artery. - Heart catheterization in August 2024 revealed 50% lesion in mid LAD, 25% lesion in mid RCA, and previously treated nonsignificant second marginal lesion. - No current chest pain or shortness of breath. 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 - Experiences exertional dyspnea when climbing stairs at work which has been stable over time. - No leg swelling.  Hyperlipidemia - Long-standing hyperlipidemia, managed since his thirties. - Lipid panel from August 2024: total cholesterol 191 mg/dL, HDL 35 mg/dL, LDL 872 mg/dL, triglycerides 853 mg/dL. - Regular monitoring of lipid levels.  Medication adherence and side effects - Currently taking Plavix ; experienced a two-week lapse in therapy due to refill issue. - Discontinued aspirin  due to rectal bleeding. - Taking metoprolol  tartrate 37.5 mg twice daily regularly.  Functional status - Works in [employer] and uses job as physical activity. - Able to perform work duties without limitation except for exertional dyspnea when climbing stairs.          ROS: Remaining review of systems negative  Studies Reviewed: SABRA   EKG Interpretation Date/Time:  Friday July 12 2024 10:46:19 EST Ventricular Rate:  72 PR Interval:  166 QRS Duration:  70 QT Interval:  382 QTC Calculation: 418 R Axis:   -4  Text  Interpretation: Normal sinus rhythm Minimal voltage criteria for LVH, may be normal variant ( R in aVL ) Inferior infarct , age undetermined Abnormal ECG When compared with ECG of 25-Jan-2023 07:12, T wave inversion more evident in Inferior leads Confirmed by Kriste Hicks [phone]) on 07/12/2024 11:00:12 AM    Results Labs Lipid panel (01/25/2023): Total cholesterol 191, HDL 35, LDL 127, lipoprotein A undetectable, triglycerides 146, VLDL 29  Diagnostic Cardiac catheterization (01/25/2023): 50% stenosis mid LAD, 25% stenosis mid RCA, nonsignificant second marginal lesion previously treated, moderately elevated left ventricular end-diastolic pressure Cardiac catheterization (2020): 50% stenosis mid LAD, 25% stenosis mid RCA, 99% stenosis OM2 treated with drug-eluting stent Echocardiogram (2020): Ejection fraction 50-55%, mild hypokinesis basal to mid inferolateral wall, grade 1 diastolic dysfunction, normal right ventricular function Risk Assessment/Calculations:             Physical Exam:   VS:  BP 132/86   Pulse 72   Ht 5' 10 (1.778 m)   Wt 261 lb (118.4 kg)   BMI 37.45 kg/m    Wt Readings from Last 3 Encounters:  07/12/24 261 lb (118.4 kg)  04/25/24 265 lb (120.2 kg)  04/24/24 268 lb (121.6 kg)    GEN: Well nourished, well developed in no acute distress NECK: No JVD; No carotid bruits CARDIAC:  RRR, no murmurs, no rubs, no gallops RESPIRATORY:  Clear to auscultation without rales, wheezing or rhonchi  ABDOMEN: Soft, non-tender, non-distended EXTREMITIES:  No edema; No deformity   ASSESSMENT AND PLAN: .    Assessment and Plan Assessment & Plan Coronary artery disease  with prior STEMI s/p DES to OM 2 in 2020 Coronary artery disease with inferior STEMI in 2020, treated with DES to OM2.  No chest pain but has stable exertional shortness of breath.  EKG stable. Previous heart catheterization in 2024 showed CAD. Discontinued aspirin  due to bleeding, currently on clopidogrel   monotherapy. - Changed metoprolol  tartrate to metoprolol  succinate 75 mg once daily. - Ordered echocardiogram to assess cardiac function.  Hypertension Managed with metoprolol  tartrate. Transitioning to metoprolol  succinate for convenience and efficacy. - Changed metoprolol  tartrate to metoprolol  succinate 75 mg once daily to limit pill burden.  Hyperlipidemia Previous lipid panel in 2024 showed total cholesterol 191, HDL 35, LDL 127, triglycerides 146. No recent lipid panel since then. - Ordered lipid panel.            Follow up: 1 year  Signed, Emeline FORBES Calender, DO  07/12/2024 11:33 AM     [facility] "

## 2024-07-12 NOTE — Patient Instructions (Addendum)
 Medication Instructions:   START TAKING:  METOPROLOL  SUCCINATE  50 MG  ( 1 TABLET IN HALF  )  =  75 MG ONCE  A  DAY    STOP TAKING AND REMOVE THIS MEDICATION FROM YOUR MEDICATION LIST:  Metoprolol    TARTRATE  37.5 MG    *If you need a refill on your cardiac medications before your next appointment, please call your pharmacy*  Lab Work:  PLEASE GO DOWN STAIRS  [facility]  FIRST FLOOR   ( GET OFF ELEVATORS WALK TOWARDS WAITING AREA LAB LOCATED BY PHARMACY):  LIPID TODAY     If you have labs (blood work) drawn today and your tests are completely normal, you will receive your results only by: MyChart Message (if you have MyChart) OR A paper copy in the mail If you have any lab test that is abnormal or we need to change your treatment, we will call you to review the results.  Testing/Procedures: Your physician has requested that you have an echocardiogram. Echocardiography is a painless test that uses sound waves to create images of your heart. It provides your doctor with information about the size and shape of your heart and how well your hearts chambers and valves are working. This procedure takes approximately one hour. There are no restrictions for this procedure. Please do NOT wear cologne, perfume, aftershave, or lotions (deodorant is allowed). Please arrive 15 minutes prior to your appointment time.  Please note: We ask at that you not bring children with you during ultrasound (echo/ vascular) testing. Due to room size and safety concerns, children are not allowed in the ultrasound rooms during exams. Our front office staff cannot provide observation of children in our lobby area while testing is being conducted. An adult accompanying a patient to their appointment will only be allowed in the ultrasound room at the discretion of the ultrasound technician under special circumstances. We apologize for any inconvenience.    Follow-Up: At [facility], you and your health needs are  our priority.  As part of our continuing mission to provide you with exceptional heart care, our providers are all part of one team.  This team includes your primary Cardiologist (physician) and Advanced Practice Providers or APPs (Physician Assistants and Nurse Practitioners) who all work together to provide you with the care you need, when you need it.  Your next appointment:   1 year(s)    Provider:   Dr. Kriste    We recommend signing up for the patient portal called MyChart.  Sign up information is provided on this After Visit Summary.  MyChart is used to connect with patients for Virtual Visits (Telemedicine).  Patients are able to view lab/test results, encounter notes, upcoming appointments, etc.  Non-urgent messages can be sent to your provider as well.   To learn more about what you can do with MyChart, go to [email or web address].   Other Instructions

## 2024-07-15 ENCOUNTER — Ambulatory Visit: Payer: Self-pay | Admitting: Internal Medicine

## 2024-07-16 MED ORDER — ATORVASTATIN CALCIUM 80 MG PO TABS: 80.0000 mg | ORAL_TABLET | Freq: Every day | ORAL | 3 refills | Status: AC

## 2024-07-16 MED ORDER — EZETIMIBE 10 MG PO TABS: 10.0000 mg | ORAL_TABLET | Freq: Every day | ORAL | 3 refills | Status: AC

## 2024-07-16 MED ORDER — CLOPIDOGREL BISULFATE 75 MG PO TABS: 75.0000 mg | ORAL_TABLET | Freq: Every day | ORAL | 3 refills | Status: AC

## 2024-08-21 ENCOUNTER — Ambulatory Visit (HOSPITAL_COMMUNITY)
